# Patient Record
Sex: Female | Born: 1945 | Hispanic: No | Smoking: Former smoker
Health system: Southern US, Community
[De-identification: ages and names within clinical notes are randomized; demographics above are authoritative.]

## PROBLEM LIST (undated history)

## (undated) DIAGNOSIS — M8580 Other specified disorders of bone density and structure, unspecified site: Secondary | ICD-10-CM

## (undated) DIAGNOSIS — R0602 Shortness of breath: Secondary | ICD-10-CM

## (undated) DIAGNOSIS — M545 Low back pain, unspecified: Secondary | ICD-10-CM

## (undated) DIAGNOSIS — K219 Gastro-esophageal reflux disease without esophagitis: Secondary | ICD-10-CM

## (undated) DIAGNOSIS — I119 Hypertensive heart disease without heart failure: Secondary | ICD-10-CM

## (undated) DIAGNOSIS — G8929 Other chronic pain: Secondary | ICD-10-CM

## (undated) DIAGNOSIS — E785 Hyperlipidemia, unspecified: Secondary | ICD-10-CM

## (undated) DIAGNOSIS — J42 Unspecified chronic bronchitis: Secondary | ICD-10-CM

## (undated) DIAGNOSIS — F411 Generalized anxiety disorder: Secondary | ICD-10-CM

## (undated) HISTORY — DX: Other specified disorders of bone density and structure, unspecified site: M85.80

## (undated) HISTORY — DX: Low back pain: M54.5

## (undated) HISTORY — DX: Shortness of breath: R06.02

## (undated) HISTORY — DX: Hyperlipidemia, unspecified: E78.5

## (undated) HISTORY — DX: Other chronic pain: G89.29

## (undated) HISTORY — DX: Gastro-esophageal reflux disease without esophagitis: K21.9

## (undated) HISTORY — DX: Generalized anxiety disorder: F41.1

## (undated) HISTORY — DX: Low back pain, unspecified: M54.50

## (undated) HISTORY — DX: Unspecified chronic bronchitis: J42

## (undated) HISTORY — DX: Hypertensive heart disease without heart failure: I11.9

---

## 1968-03-06 HISTORY — PX: CHOLECYSTECTOMY: SHX55

## 1968-03-06 HISTORY — PX: APPENDECTOMY: SHX54

## 2013-09-22 ENCOUNTER — Encounter: Payer: Self-pay | Admitting: Critical Care Medicine

## 2013-09-23 ENCOUNTER — Telehealth: Payer: Self-pay | Admitting: Critical Care Medicine

## 2013-09-23 ENCOUNTER — Encounter: Payer: Self-pay | Admitting: Critical Care Medicine

## 2013-09-23 ENCOUNTER — Ambulatory Visit (INDEPENDENT_AMBULATORY_CARE_PROVIDER_SITE_OTHER): Payer: Self-pay | Admitting: Critical Care Medicine

## 2013-09-23 VITALS — BP 132/68 | HR 100 | Temp 98.9°F | Ht 65.5 in | Wt 192.4 lb

## 2013-09-23 DIAGNOSIS — K219 Gastro-esophageal reflux disease without esophagitis: Secondary | ICD-10-CM | POA: Insufficient documentation

## 2013-09-23 DIAGNOSIS — M8580 Other specified disorders of bone density and structure, unspecified site: Secondary | ICD-10-CM | POA: Insufficient documentation

## 2013-09-23 DIAGNOSIS — E669 Obesity, unspecified: Secondary | ICD-10-CM | POA: Insufficient documentation

## 2013-09-23 DIAGNOSIS — J432 Centrilobular emphysema: Secondary | ICD-10-CM

## 2013-09-23 DIAGNOSIS — J209 Acute bronchitis, unspecified: Secondary | ICD-10-CM

## 2013-09-23 DIAGNOSIS — J441 Chronic obstructive pulmonary disease with (acute) exacerbation: Secondary | ICD-10-CM | POA: Insufficient documentation

## 2013-09-23 DIAGNOSIS — E785 Hyperlipidemia, unspecified: Secondary | ICD-10-CM | POA: Insufficient documentation

## 2013-09-23 DIAGNOSIS — I119 Hypertensive heart disease without heart failure: Secondary | ICD-10-CM | POA: Insufficient documentation

## 2013-09-23 MED ORDER — ACLIDINIUM BROMIDE 400 MCG/ACT IN AEPB: 1.0000 | INHALATION_SPRAY | Freq: Two times a day (BID) | RESPIRATORY_TRACT | Status: DC

## 2013-09-23 MED ORDER — METHYLPREDNISOLONE ACETATE 80 MG/ML IJ SUSP
120.0000 mg | Freq: Once | INTRAMUSCULAR | Status: AC
  Administered 2013-09-23: 120 mg via INTRAMUSCULAR

## 2013-09-23 MED ORDER — PREDNISONE 5 MG PO TABS: ORAL_TABLET | ORAL | Status: DC

## 2013-09-23 MED ORDER — AZITHROMYCIN 250 MG PO TABS: ORAL_TABLET | ORAL | Status: DC

## 2013-09-23 NOTE — Progress Notes (Signed)
Subjective:    Patient ID: Teresa Jenkins, female    DOB: [birth year], 68 y.o.   MRN: [id number]  HPI Comments: Ref : thomas whyte.  Hx of dyspnea and more labored.  On oxygen for years. Dx Copd.  No longer smoking, none since 1995.  On 2Liters qhs and 3L exertion  Shortness of Breath This is a chronic problem. The current episode started more than 1 year ago. The problem occurs daily (exertion only, short distance only. ok at rest). The problem has been gradually worsening. Associated symptoms include wheezing. Pertinent negatives include no abdominal pain, chest pain, claudication, fever, headaches, hemoptysis, leg pain, leg swelling, orthopnea, PND, rash, rhinorrhea, sore throat, sputum production, swollen glands, syncope or vomiting. The symptoms are aggravated by weather changes, URIs, pollens and any activity. Associated symptoms comments: Feels like mucus, wont come up Hx of GERD, controlled . Risk factors include smoking. She has tried beta agonist inhalers (pulm rehab: 60yrs ago.  ) for the symptoms. The treatment provided moderate relief. Her past medical history is significant for COPD. There is no history of allergies, aspirin allergies, asthma, bronchiolitis, CAD, DVT, a heart failure, PE, pneumonia or a recent surgery.  Hx of hypothyroidism.    Past Medical History  Diagnosis Date  . Chronic bronchitis   . Benign hypertensive heart disease   . GAD (generalized anxiety disorder)   . SOB (shortness of breath)   . Acid reflux   . Chronic low back pain   . Hyperlipidemia   . Osteopenia      Family History  Problem Relation Age of Onset  . Heart disease Father   . Breast cancer Mother   . Hypertension Mother   . Hypertension Father   . Emphysema Father   . Allergies       History   Social History  . Marital Status: Unknown    Spouse Name: N/A    Number of Children: N/A  . Years of Education: N/A   Occupational History  . retired     Social worker, [employer]    Social History Main Topics  . Smoking status: Former Smoker -- 1.00 packs/day for 30 years    Types: Cigarettes    Start date: 03/07/1963    Quit date: 03/06/1993  . Smokeless tobacco: Never Used  . Alcohol Use: No  . Drug Use: No  . Sexual Activity: Not on file   Other Topics Concern  . Not on file   Social History Narrative  . No narrative on file     No Known Allergies   Outpatient Prescriptions Prior to Visit  Medication Sig Dispense Refill  . albuterol (PROVENTIL HFA;VENTOLIN HFA) 108 (90 BASE) MCG/ACT inhaler Inhale 1-2 puffs into the lungs every 6 (six) hours as needed for wheezing or shortness of breath.      Marland Kitchen aspirin 81 MG tablet Take 81 mg by mouth daily.      . fluticasone (FLONASE) 50 MCG/ACT nasal spray Place 2 sprays into both nostrils daily as needed.       . formoterol (PERFOROMIST) 20 MCG/2ML nebulizer solution Take 20 mcg by nebulization 2 (two) times daily.      Marland Kitchen levothyroxine (SYNTHROID, LEVOTHROID) 75 MCG tablet Take 75 mcg by mouth daily before breakfast.      . loratadine (CLARITIN) 10 MG tablet Take 10 mg by mouth daily.      Marland Kitchen omeprazole (PRILOSEC) 20 MG capsule Take 20 mg by mouth daily.      Marland Kitchen  OXYGEN-HELIUM IN Inhale into the lungs. 2LPM 24/7      . PARoxetine (PAXIL) 40 MG tablet Take 20 mg by mouth every morning.       . Potassium Gluconate 595 MG CAPS Take 1 capsule by mouth daily.      . pravastatin (PRAVACHOL) 20 MG tablet Take 20 mg by mouth daily.      Marland Kitchen tiotropium (SPIRIVA) 18 MCG inhalation capsule Place 18 mcg into inhaler and inhale daily.      . calcium carbonate (OS-CAL) 600 MG TABS tablet Take 600 mg by mouth daily with breakfast.       . predniSONE (DELTASONE) 5 MG tablet Take 5 mg by mouth daily with breakfast.      . alendronate (FOSAMAX) 70 MG tablet Take 70 mg by mouth once a week. Take with a full glass of water on an empty stomach.      . budesonide (PULMICORT) 0.25 MG/2ML nebulizer solution Take 0.25 mg by nebulization 2 (two)  times daily.      . Multiple Vitamins-Minerals (PRESERVISION AREDS 2 PO) Take 1 tablet by mouth daily.       No facility-administered medications prior to visit.      Review of Systems  Constitutional: Positive for fatigue. Negative for fever.  HENT: Positive for postnasal drip, sinus pressure, trouble swallowing and voice change. Negative for rhinorrhea and sore throat.   Respiratory: Positive for cough, choking, shortness of breath and wheezing. Negative for apnea, hemoptysis, sputum production, chest tightness and stridor.        Chokes on dried foods  Cardiovascular: Negative for chest pain, orthopnea, claudication, leg swelling, syncope and PND.  Gastrointestinal: Negative.  Negative for vomiting and abdominal pain.  Skin: Negative for rash.  Neurological: Negative for headaches.       Objective:   Physical Exam Filed Vitals:   09/23/13 1001 09/23/13 1005  BP:  132/68  Pulse: 125 100  Temp:  98.9 F (37.2 C)  TempSrc:  Oral  Height:  5' 5.5" (1.664 m)  Weight:  192 lb 6.4 oz (87.272 kg)  SpO2: 88% 94%    Gen: Pleasant, well-nourished, in no distress,  normal affect  ENT: No lesions,  mouth clear,  oropharynx clear, no postnasal drip  Neck: No JVD, no TMG, no carotid bruits  Lungs: No use of accessory muscles, no dullness to percussion, expired wheezes with poor airflow  Cardiovascular: RRR, heart sounds normal, no murmur or gallops, no peripheral edema  Abdomen: soft and NT, no HSM,  BS normal  Musculoskeletal: No deformities, no cyanosis or clubbing  Neuro: alert, non focal  Skin: Warm, no lesions or rashes  No results found.    Radiographs reviewed and showed COPD changes    Assessment & Plan:   Bronchitis, chronic obstructive, with exacerbation Chronic obstructive lung disease with acute bronchitic flare The patient is a former smoker Plan Azithromycin 250mg  Take two once then one daily until gone Depomedrol 120mg  IM Spirometry will be  obtained Referral to pulmonary rehab Stop spiriva Start tudorza one puff twice daily Stay on oxygen  Stay on perforomist/budesonide Return 2 months   Significant weight gain with BMI greater than 30 The patient was given instructions as to dietary modifications and referral to pulmonary rehabilitation   Updated Medication List Outpatient Encounter Prescriptions as of 09/23/2013  Medication Sig  . albuterol (PROVENTIL HFA;VENTOLIN HFA) 108 (90 BASE) MCG/ACT inhaler Inhale 1-2 puffs into the lungs every 6 (six) hours as needed for wheezing  or shortness of breath.  Marland Kitchen. aspirin 81 MG tablet Take 81 mg by mouth daily.  . budesonide (PULMICORT) 0.5 MG/2ML nebulizer solution Take 0.5 mg by nebulization 2 (two) times daily.  . calcium citrate-vitamin D 500-400 MG-UNIT chewable tablet Chew 1 tablet by mouth daily.  . fluticasone (FLONASE) 50 MCG/ACT nasal spray Place 2 sprays into both nostrils daily as needed.   . formoterol (PERFOROMIST) 20 MCG/2ML nebulizer solution Take 20 mcg by nebulization 2 (two) times daily.  Marland Kitchen. levothyroxine (SYNTHROID, LEVOTHROID) 75 MCG tablet Take 75 mcg by mouth daily before breakfast.  . loratadine (CLARITIN) 10 MG tablet Take 10 mg by mouth daily.  . Magnesium 250 MG TABS Take 1 tablet by mouth daily.  . Misc Natural Products (DAILY HERBS VISION HEALTH PO) Take 1 tablet by mouth daily.  Marland Kitchen. omeprazole (PRILOSEC) 20 MG capsule Take 20 mg by mouth daily.  Docia Barrier. OXYGEN-HELIUM IN Inhale into the lungs. 2LPM 24/7  . PARoxetine (PAXIL) 40 MG tablet Take 20 mg by mouth every morning.   . Potassium Gluconate 595 MG CAPS Take 1 capsule by mouth daily.  . pravastatin (PRAVACHOL) 20 MG tablet Take 20 mg by mouth daily.  . predniSONE (DELTASONE) 5 MG tablet Reduce to  5mg  every other day times two weeks then 5mg  three times weekly for two weeks then STOP  . tiotropium (SPIRIVA) 18 MCG inhalation capsule Place 18 mcg into inhaler and inhale daily.  . [DISCONTINUED] calcium carbonate  (OS-CAL) 600 MG TABS tablet Take 600 mg by mouth daily with breakfast.   . [DISCONTINUED] predniSONE (DELTASONE) 5 MG tablet Take 5 mg by mouth daily with breakfast.  . Aclidinium Bromide (TUDORZA PRESSAIR) 400 MCG/ACT AEPB Inhale 1 puff into the lungs 2 (two) times daily.  Marland Kitchen. azithromycin (ZITHROMAX) 250 MG tablet Take two once then one daily until gone  . [DISCONTINUED] alendronate (FOSAMAX) 70 MG tablet Take 70 mg by mouth once a week. Take with a full glass of water on an empty stomach.  . [DISCONTINUED] budesonide (PULMICORT) 0.25 MG/2ML nebulizer solution Take 0.25 mg by nebulization 2 (two) times daily.  . [DISCONTINUED] Multiple Vitamins-Minerals (PRESERVISION AREDS 2 PO) Take 1 tablet by mouth daily.  . [EXPIRED] methylPREDNISolone acetate (DEPO-MEDROL) injection 120 mg

## 2013-09-23 NOTE — Telephone Encounter (Signed)
Spoke with pt - aware [facility].  This has been corrected.  [facility] [place] [address]  Sherron MondaySpoke with [facility] and cancelled rx.  Nothing further needed.

## 2013-09-23 NOTE — Assessment & Plan Note (Signed)
Chronic obstructive lung disease with acute bronchitic flare The patient is a former smoker Plan Azithromycin 250mg  Take two once then one daily until gone Depomedrol 120mg  IM Spirometry will be obtained Referral to pulmonary rehab Stop spiriva Start tudorza one puff twice daily Stay on oxygen  Stay on perforomist/budesonide Return 2 months

## 2013-09-23 NOTE — Patient Instructions (Signed)
Azithromycin 250mg  Take two once then one daily until gone Depomedrol 120mg  IM Spirometry will be obtained Referral to pulmonary rehab Stop spiriva Start tudorza one puff twice daily Stay on oxygen  Stay on perforomist/budesonide Return 2 months

## 2013-10-14 ENCOUNTER — Telehealth: Payer: Self-pay | Admitting: Critical Care Medicine

## 2013-10-14 DIAGNOSIS — J441 Chronic obstructive pulmonary disease with (acute) exacerbation: Secondary | ICD-10-CM

## 2013-10-14 NOTE — Telephone Encounter (Signed)
Tell pt spiro shows good response to BDs.  Shows severe obstruction.  Stay on program Rx at 09/2013 Ov

## 2013-10-14 NOTE — Telephone Encounter (Signed)
Called, spoke with pt.  Informed her of pft results and recs per Dr. Delford FieldWright.  She verbalized understanding and voiced no further questions or concerns at this time.  Results placed in scan folder.

## 2013-11-25 ENCOUNTER — Ambulatory Visit (INDEPENDENT_AMBULATORY_CARE_PROVIDER_SITE_OTHER): Payer: Self-pay | Admitting: Critical Care Medicine

## 2013-11-25 ENCOUNTER — Encounter: Payer: Self-pay | Admitting: Critical Care Medicine

## 2013-11-25 VITALS — BP 138/78 | HR 81 | Temp 98.6°F | Ht 65.5 in | Wt 184.4 lb

## 2013-11-25 DIAGNOSIS — J441 Chronic obstructive pulmonary disease with (acute) exacerbation: Secondary | ICD-10-CM

## 2013-11-25 MED ORDER — BUDESONIDE 0.5 MG/2ML IN SUSP: 0.5000 mg | Freq: Every day | RESPIRATORY_TRACT | Status: DC

## 2013-11-25 NOTE — Patient Instructions (Signed)
Reduce budesonide to daily Continue perforomist twice daily We will get you in pulmonary rehab Return 4 months

## 2013-11-25 NOTE — Progress Notes (Signed)
Subjective:    Patient ID: Teresa Jenkins, female    DOB: 1945-10-02, 68 y.o.   MRN: [id number]  HPI Comments: Ref : thomas whyte.  Hx of dyspnea and more labored.  On oxygen for years. Dx Copd.  No longer smoking, none since 1995.  On 2Liters qhs and 3L exertion Hx of hypothyroidism.    11/25/2013 Chief Complaint  Patient presents with  . Follow-up    sob better since weather change,wheezing,no cough,denies cp , tightness when sob only  Noone called yet from pulm rehab.  No cough.  No chest pain, just tightness if gets out of breath.  Occ mucus, tinged yellow.  No blood.   Pulse ox at home:  95% at home.     Review of Systems  Constitutional: Positive for fatigue.  HENT: Positive for postnasal drip and sinus pressure. Negative for trouble swallowing and voice change.   Respiratory: Positive for cough. Negative for apnea, choking, chest tightness and stridor.        Chokes on dried foods  Gastrointestinal: Negative.        Objective:   Physical Exam  Filed Vitals:   11/25/13 1204  BP: 138/78  Pulse: 81  Temp: 98.6 F (37 C)  TempSrc: Oral  Height: 5' 5.5" (1.664 m)  Weight: 184 lb 6.4 oz (83.643 kg)  SpO2: 95%    Gen: Pleasant, well-nourished, in no distress,  normal affect  ENT: No lesions,  mouth clear,  oropharynx clear, no postnasal drip  Neck: No JVD, no TMG, no carotid bruits  Lungs: No use of accessory muscles, no dullness to percussion, distant breath sounds  Cardiovascular: RRR, heart sounds normal, no murmur or gallops, no peripheral edema  Abdomen: soft and NT, no HSM,  BS normal  Musculoskeletal: No deformities, no cyanosis or clubbing  Neuro: alert, non focal  Skin: Warm, no lesions or rashes  No results found.    Radiographs reviewed and showed COPD changes    Assessment & Plan:   Bronchitis, chronic obstructive, with exacerbation Chronic obstructive lung disease stable at this time Plan Reduce budesonide to daily Continue perforomist  twice daily Referral to  pulmonary rehab Return 4 months      Updated Medication List Outpatient Encounter Prescriptions as of 11/25/2013  Medication Sig  . Aclidinium Bromide (TUDORZA PRESSAIR) 400 MCG/ACT AEPB Inhale 1 puff into the lungs 2 (two) times daily.  Marland Kitchen albuterol (PROVENTIL HFA;VENTOLIN HFA) 108 (90 BASE) MCG/ACT inhaler Inhale 1-2 puffs into the lungs every 6 (six) hours as needed for wheezing or shortness of breath.  Marland Kitchen aspirin 81 MG tablet Take 81 mg by mouth. Take 1 tablet every other day by mouth  . budesonide (PULMICORT) 0.5 MG/2ML nebulizer solution Take 2 mLs (0.5 mg total) by nebulization daily.  . calcium citrate-vitamin D 500-400 MG-UNIT chewable tablet Chew 1 tablet by mouth daily.  . fluticasone (FLONASE) 50 MCG/ACT nasal spray Place 2 sprays into both nostrils daily as needed.   . formoterol (PERFOROMIST) 20 MCG/2ML nebulizer solution Take 20 mcg by nebulization 2 (two) times daily.  Marland Kitchen levothyroxine (SYNTHROID, LEVOTHROID) 75 MCG tablet Take 75 mcg by mouth daily before breakfast.  . loratadine (CLARITIN) 10 MG tablet Take 10 mg by mouth daily.  . Magnesium 250 MG TABS Take 1 tablet by mouth daily.  . Misc Natural Products (DAILY HERBS VISION HEALTH PO) Take 1 tablet by mouth daily.  Marland Kitchen omeprazole (PRILOSEC) 20 MG capsule Take 20 mg by mouth daily.  Marland Kitchen PARoxetine (  PAXIL) 40 MG tablet Take 20 mg by mouth every morning.   . Potassium Gluconate 595 MG CAPS Take 1 capsule by mouth daily.  . pravastatin (PRAVACHOL) 20 MG tablet Take 20 mg by mouth daily.  . [DISCONTINUED] budesonide (PULMICORT) 0.5 MG/2ML nebulizer solution Take 0.5 mg by nebulization 2 (two) times daily.  . [DISCONTINUED] OXYGEN-HELIUM IN Inhale into the lungs. 2LPM 24/7  . [DISCONTINUED] azithromycin (ZITHROMAX) 250 MG tablet Take two once then one daily until gone  . [DISCONTINUED] predniSONE (DELTASONE) 5 MG tablet Reduce to   every other day times two weeks then  three times weekly for two  weeks then STOP  . [DISCONTINUED] tiotropium (SPIRIVA) 18 MCG inhalation capsule Place 18 mcg into inhaler and inhale daily.

## 2013-11-26 NOTE — Assessment & Plan Note (Signed)
Chronic obstructive lung disease stable at this time Plan Reduce budesonide to daily Continue perforomist twice daily Referral to  pulmonary rehab Return 4 months

## 2014-01-09 ENCOUNTER — Telehealth: Payer: Self-pay | Admitting: Critical Care Medicine

## 2014-01-09 NOTE — Telephone Encounter (Signed)
Called and spoke with pt and she stated that her [place] will be covered by her insurance until January.  She stated at that time she will need to change to spirive (she was on this before [place]).  She will need this sent over to [facility].  Pt wanted to make sure that this is ok.  PW please advise. Thanks  No Known Allergies  Current Outpatient Prescriptions on File Prior to Visit  Medication Sig Dispense Refill  . Aclidinium Bromide (TUDORZA PRESSAIR) 400 MCG/ACT AEPB Inhale 1 puff into the lungs 2 (two) times daily. 1 each 11  . albuterol (PROVENTIL HFA;VENTOLIN HFA) 108 (90 BASE) MCG/ACT inhaler Inhale 1-2 puffs into the lungs every 6 (six) hours as needed for wheezing or shortness of breath.    Marland Kitchen. aspirin 81 MG tablet Take 81 mg by mouth. Take 1 tablet every other day by mouth    . budesonide (PULMICORT) 0.5 MG/2ML nebulizer solution Take 2 mLs (0.5 mg total) by nebulization daily. 60 mL 6  . calcium citrate-vitamin D 500-400 MG-UNIT chewable tablet Chew 1 tablet by mouth daily.    . fluticasone (FLONASE) 50 MCG/ACT nasal spray Place 2 sprays into both nostrils daily as needed.     . formoterol (PERFOROMIST) 20 MCG/2ML nebulizer solution Take 20 mcg by nebulization 2 (two) times daily.    Marland Kitchen. levothyroxine (SYNTHROID, LEVOTHROID) 75 MCG tablet Take 75 mcg by mouth daily before breakfast.    . loratadine (CLARITIN) 10 MG tablet Take 10 mg by mouth daily.    . Magnesium 250 MG TABS Take 1 tablet by mouth daily.    . Misc Natural Products (DAILY HERBS VISION HEALTH PO) Take 1 tablet by mouth daily.    Marland Kitchen. omeprazole (PRILOSEC) 20 MG capsule Take 20 mg by mouth daily.    Marland Kitchen. PARoxetine (PAXIL) 40 MG tablet Take 20 mg by mouth every morning.     . Potassium Gluconate 595 MG CAPS Take 1 capsule by mouth daily.    . pravastatin (PRAVACHOL) 20 MG tablet Take 20 mg by mouth daily.     No current facility-administered medications on file prior to visit.

## 2014-01-10 NOTE — Telephone Encounter (Signed)
Ok to switch to spiriva respimat two puff daily

## 2014-01-12 MED ORDER — TIOTROPIUM BROMIDE MONOHYDRATE 2.5 MCG/ACT IN AERS: 2.0000 | INHALATION_SPRAY | Freq: Every day | RESPIRATORY_TRACT | Status: DC

## 2014-01-12 NOTE — Telephone Encounter (Signed)
RX printed, signed, and faxed to [facility].  Nothing further needed.

## 2014-04-14 ENCOUNTER — Ambulatory Visit (INDEPENDENT_AMBULATORY_CARE_PROVIDER_SITE_OTHER): Payer: Self-pay | Admitting: Critical Care Medicine

## 2014-04-14 ENCOUNTER — Encounter: Payer: Self-pay | Admitting: Critical Care Medicine

## 2014-04-14 VITALS — BP 150/88 | HR 78 | Temp 98.2°F | Ht 65.5 in | Wt 182.6 lb

## 2014-04-14 DIAGNOSIS — J449 Chronic obstructive pulmonary disease, unspecified: Secondary | ICD-10-CM

## 2014-04-14 DIAGNOSIS — J441 Chronic obstructive pulmonary disease with (acute) exacerbation: Secondary | ICD-10-CM

## 2014-04-14 NOTE — Progress Notes (Signed)
Subjective:    Patient ID: Teresa Jenkins, female    DOB: [birth year], 69 y.o.   MRN: [id number]  HPI Comments: Ref : thomas whyte.  Hx of dyspnea and more labored.  On oxygen for years. Dx Copd.  No longer smoking, none since 1995.  On 2Liters qhs and 3L exertion Hx of hypothyroidism.      04/14/2014 Chief Complaint  Patient presents with  . Follow-up    sob-same,no wheezing, no cough.Denies cp or tightness.Had sinus infection,had abx. with primary MD and Prednisone 1 wk. ago.    Not real change from 11/2013.  Pt remains on perforomist /budesonide.  Pt went to pulm rehab but had to quit.  Pt has a TMT at home and exercise bike at home. No smoking.  No wheeze or cough. PCP rx pred and abx one week ago for sinus.  Sinus still congested.  No mucus.  No chest pain. NO f/c/s.  On oxygen 2Liters continuous Pt denies any significant sore throat, nasal congestion or excess secretions, fever, chills, sweats, unintended weight loss, pleurtic or exertional chest pain, orthopnea PND, or leg swelling Pt denies any increase in rescue therapy over baseline, denies waking up needing it or having any early am or nocturnal exacerbations of coughing/wheezing/or dyspnea. Pt also denies any obvious fluctuation in symptoms with  weather or environmental change or other alleviating or aggravating factors     Review of Systems  Constitutional: Positive for fatigue.  HENT: Positive for postnasal drip and sinus pressure. Negative for trouble swallowing and voice change.   Respiratory: Positive for cough. Negative for apnea, choking, chest tightness and stridor.        Chokes on dried foods  Gastrointestinal: Negative.        Objective:   Physical Exam Filed Vitals:   04/14/14 1222  BP: 150/88  Pulse: 78  Temp: 98.2 F (36.8 C)  TempSrc: Oral  Height: 5' 5.5" (1.664 m)  Weight: 182 lb 9.6 oz (82.827 kg)  SpO2: 92%    Gen: Pleasant, well-nourished, in no distress,  normal affect  ENT: No lesions,   mouth clear,  oropharynx clear, no postnasal drip  Neck: No JVD, no TMG, no carotid bruits  Lungs: No use of accessory muscles, no dullness to percussion, distant breath sounds  Cardiovascular: RRR, heart sounds normal, no murmur or gallops, no peripheral edema  Abdomen: soft and NT, no HSM,  BS normal  Musculoskeletal: No deformities, no cyanosis or clubbing  Neuro: alert, non focal  Skin: Warm, no lesions or rashes  No results found.        Assessment & Plan:   Bronchitis, chronic obstructive, with exacerbation Chronic obstructive lung disease with associated asthmatic bronchitic component stable at this time Plan Continue inhaled medications as prescribed      Updated Medication List Outpatient Encounter Prescriptions as of 04/14/2014  Medication Sig  . albuterol (PROVENTIL HFA;VENTOLIN HFA) 108 (90 BASE) MCG/ACT inhaler Inhale 1-2 puffs into the lungs every 6 (six) hours as needed for wheezing or shortness of breath.  Marland Kitchen aspirin 81 MG tablet Take 81 mg by mouth. Take 1 tablet every other day by mouth  . budesonide (PULMICORT) 0.5 MG/2ML nebulizer solution Take 2 mLs (0.5 mg total) by nebulization daily.  . calcium citrate-vitamin D 500-400 MG-UNIT chewable tablet Chew 1 tablet by mouth daily.  . fluticasone (FLONASE) 50 MCG/ACT nasal spray Place 2 sprays into both nostrils daily as needed.   . formoterol (PERFOROMIST) 20 MCG/2ML nebulizer solution Take  20 mcg by nebulization 2 (two) times daily.  Marland Kitchen. levothyroxine (SYNTHROID, LEVOTHROID) 75 MCG tablet Take 75 mcg by mouth daily before breakfast.  . loratadine (CLARITIN) 10 MG tablet Take 10 mg by mouth daily.  . Misc Natural Products (DAILY HERBS VISION HEALTH PO) Take 1 tablet by mouth daily.  . Multiple Vitamin (MULTIVITAMIN) capsule Take 1 capsule by mouth daily.  Marland Kitchen. omeprazole (PRILOSEC) 20 MG capsule Take 20 mg by mouth daily.  Marland Kitchen. PARoxetine (PAXIL) 40 MG tablet Take 37.5 mg by mouth every morning.   . pravastatin  (PRAVACHOL) 20 MG tablet Take 20 mg by mouth daily.  . Tiotropium Bromide Monohydrate (SPIRIVA RESPIMAT) 2.5 MCG/ACT AERS Inhale 2 puffs into the lungs daily.  . [DISCONTINUED] Magnesium 250 MG TABS Take 1 tablet by mouth daily.  . [DISCONTINUED] Potassium Gluconate 595 MG CAPS Take 1 capsule by mouth daily.

## 2014-04-14 NOTE — Assessment & Plan Note (Signed)
Chronic obstructive lung disease with associated asthmatic bronchitic component stable at this time Plan Continue inhaled medications as prescribed

## 2014-04-14 NOTE — Patient Instructions (Signed)
No change in medications. Return in         6 months Use distilled water in your nedi pot

## 2014-10-20 ENCOUNTER — Ambulatory Visit (INDEPENDENT_AMBULATORY_CARE_PROVIDER_SITE_OTHER): Payer: Self-pay | Admitting: Critical Care Medicine

## 2014-10-20 ENCOUNTER — Encounter: Payer: Self-pay | Admitting: Critical Care Medicine

## 2014-10-20 VITALS — BP 130/62 | HR 90 | Temp 99.0°F | Ht 65.25 in | Wt 183.4 lb

## 2014-10-20 DIAGNOSIS — J441 Chronic obstructive pulmonary disease with (acute) exacerbation: Secondary | ICD-10-CM

## 2014-10-20 DIAGNOSIS — J9611 Chronic respiratory failure with hypoxia: Secondary | ICD-10-CM

## 2014-10-20 DIAGNOSIS — J449 Chronic obstructive pulmonary disease, unspecified: Secondary | ICD-10-CM

## 2014-10-20 NOTE — Patient Instructions (Signed)
No change in medications. Return in        6 months        

## 2014-10-20 NOTE — Assessment & Plan Note (Signed)
Chronic obstructive lung disease with stable lung function Plan Maintain inhaled medications as prescribed

## 2014-10-20 NOTE — Progress Notes (Signed)
   Subjective:    Patient ID: Teresa Jenkins, female    DOB: [birth year], 69 y.o.   MRN: [id number]  HPI 10/20/2014 Chief Complaint  Patient presents with  . Follow-up    c/o sob same with exertion,trying to stay indoors more due to humidity.Cough-white thick.Occass. wheezing.   occ wheeze, doe same. Mucus traps in lungs Pt denies any significant sore throat, nasal congestion or excess secretions, fever, chills, sweats, unintended weight loss, pleurtic or exertional chest pain, orthopnea PND, or leg swelling Pt denies any increase in rescue therapy over baseline, denies waking up needing it or having any early am or nocturnal exacerbations of coughing/wheezing/or dyspnea. Pt also denies any obvious fluctuation in symptoms with  weather or environmental change or other alleviating or aggravating factors   Current Medications, Allergies, Complete Past Medical History, Past Surgical History, Family History, and Social History were reviewed in [employer] electronic medical record per todays encounter:  10/20/2014  Review of Systems  Constitutional: Negative.   HENT: Negative.  Negative for ear pain, postnasal drip, rhinorrhea, sinus pressure, sore throat, trouble swallowing and voice change.   Eyes: Negative.   Respiratory: Positive for cough, shortness of breath and wheezing. Negative for apnea, choking, chest tightness and stridor.   Cardiovascular: Negative.  Negative for chest pain, palpitations and leg swelling.  Gastrointestinal: Negative.  Negative for nausea, vomiting, abdominal pain and abdominal distention.       Has gerd and meds help  Genitourinary: Negative.   Musculoskeletal: Negative.  Negative for myalgias and arthralgias.  Skin: Negative.  Negative for rash.  Allergic/Immunologic: Negative.  Negative for environmental allergies and food allergies.  Neurological: Negative.  Negative for dizziness, syncope, weakness and headaches.  Hematological: Negative.  Negative for  adenopathy. Does not bruise/bleed easily.  Psychiatric/Behavioral: Negative.  Negative for sleep disturbance and agitation. The patient is not nervous/anxious.        Objective:   Physical Exam Filed Vitals:   10/20/14 1201  BP: 130/62  Pulse: 90  Temp: 99 F (37.2 C)  TempSrc: Oral  Height: 5' 5.25" (1.657 m)  SpO2: 97%    Gen: Pleasant, well-nourished, in no distress,  normal affect  ENT: No lesions,  mouth clear,  oropharynx clear, no postnasal drip  Neck: No JVD, no TMG, no carotid bruits  Lungs: No use of accessory muscles, no dullness to percussion, distant bs   Cardiovascular: RRR, heart sounds normal, no murmur or gallops, no peripheral edema  Abdomen: soft and NT, no HSM,  BS normal  Musculoskeletal: No deformities, no cyanosis or clubbing  Neuro: alert, non focal  Skin: Warm, no lesions or rashes  No results found.        Assessment & Plan:  I personally reviewed all images and lab data in the [facility] system as well as any outside material available during this office visit and agree with the  radiology impressions.   Bronchitis, chronic obstructive, with exacerbation Chronic obstructive lung disease with stable lung function Plan Maintain inhaled medications as prescribed

## 2014-10-24 ENCOUNTER — Other Ambulatory Visit: Payer: Self-pay | Admitting: Critical Care Medicine

## 2015-04-19 ENCOUNTER — Ambulatory Visit: Payer: Self-pay | Admitting: Pulmonary Disease

## 2015-05-03 ENCOUNTER — Ambulatory Visit: Payer: Self-pay | Admitting: Pulmonary Disease

## 2015-05-10 ENCOUNTER — Other Ambulatory Visit: Payer: Self-pay | Admitting: Orthopedic Surgery

## 2015-05-10 DIAGNOSIS — M25532 Pain in left wrist: Secondary | ICD-10-CM

## 2015-05-31 ENCOUNTER — Other Ambulatory Visit: Payer: Self-pay

## 2016-08-30 ENCOUNTER — Institutional Professional Consult (permissible substitution): Payer: Self-pay | Admitting: Internal Medicine

## 2016-11-14 ENCOUNTER — Institutional Professional Consult (permissible substitution): Payer: Self-pay | Admitting: Internal Medicine

## 2016-12-18 ENCOUNTER — Institutional Professional Consult (permissible substitution): Payer: Self-pay | Admitting: Internal Medicine

## 2017-01-03 ENCOUNTER — Ambulatory Visit (INDEPENDENT_AMBULATORY_CARE_PROVIDER_SITE_OTHER)
Admission: RE | Admit: 2017-01-03 | Discharge: 2017-01-03 | Disposition: A | Discharge status: Home / Self Care | Payer: Medicare HMO | Source: Ambulatory Visit | Attending: Internal Medicine | Admitting: Internal Medicine

## 2017-01-03 ENCOUNTER — Encounter: Payer: Self-pay | Admitting: Internal Medicine

## 2017-01-03 ENCOUNTER — Ambulatory Visit (INDEPENDENT_AMBULATORY_CARE_PROVIDER_SITE_OTHER): Payer: Medicare HMO | Admitting: Internal Medicine

## 2017-01-03 VITALS — BP 108/60 | HR 88 | Ht 65.25 in | Wt 166.8 lb

## 2017-01-03 DIAGNOSIS — J9611 Chronic respiratory failure with hypoxia: Secondary | ICD-10-CM

## 2017-01-03 DIAGNOSIS — J449 Chronic obstructive pulmonary disease, unspecified: Secondary | ICD-10-CM | POA: Diagnosis not present

## 2017-01-03 NOTE — Progress Notes (Signed)
Subjective:     Patient ID: Teresa Jenkins, female   DOB: 04/07/1945,     MRN: [id number]  HPI  71 yowf  From [place] moved to [place] in 1970s quit smoking in 1995 with breathing bad to worse with h/o exacerbations she related to work exposure and lots of rhinitis which has persisted  eval by Chad in [place] dx mold and prev under care of Dr Blenda Nicely, Delford Field then [facility] but referred to pulmonary clinic 01/03/2017 by Dr   Martha Clan for copd eval.  Alpha one neg per [facility]    01/03/2017 1st [place] [facility] office visit/ Kasidi Shanker   Chief Complaint  Patient presents with  . Pulmonary Consult    Referred by Dr. Arlan Organ. Pt states she was dxed with COPD approx 21 years ago. She states her PCP rec that she f/u with pulm again. She has been seen here in the past by Dr Delford Field- last in 2016.  She states that today her breathing is "not too bad". She does get SOB with exertion such as grocery shopping, and she will not walk to her mailbox anymore due to having to walk up hill.    doe on best days = [employer] / [employer] ok leaning on cart on 2lpm / also 2lpm hs  = MMRC2 = can't walk a nl pace on a flat grade s sob but does fine slow and flat eg shopping leaning on cart   Presently maint on bud/ performist / incruse Peak wt 190    No obvious day to day or daytime variability or assoc  purulent sputum or mucus plugs or hemoptysis or cp or chest tightness, subjective wheeze or overt sinus or hb symptoms. No unusual exp hx or h/o childhood pna/ asthma or knowledge of premature birth.  Sleeping ok flat on 2lpm NP without nocturnal  or early am exacerbation  of respiratory  c/o's or need for noct saba. Also denies any obvious fluctuation of symptoms with weather or environmental changes or other aggravating or alleviating factors except as outlined above   Current Allergies, Complete Past Medical History, Past Surgical History, Family History, and Social History were reviewed in [employer]  record.  ROS  The following are not active complaints unless bolded Hoarseness, sore throat, dysphagia, dental problems, itching, sneezing,  nasal congestion or discharge of excess mucus or purulent secretions, ear ache,   fever, chills, sweats, unintended wt loss or wt gain, classically pleuritic or exertional cp,  orthopnea pnd or leg swelling, presyncope, palpitations, abdominal pain, anorexia, nausea, vomiting, diarrhea  or change in bowel habits or change in bladder habits, change in stools or change in urine, dysuria, hematuria,  rash, arthralgias, visual complaints, headache, numbness, weakness or ataxia or problems with walking or coordination,  change in mood/affect or memory.        Current Meds  Medication Sig  . albuterol (PROVENTIL HFA;VENTOLIN HFA) 108 (90 BASE) MCG/ACT inhaler Inhale 1-2 puffs into the lungs every 6 (six) hours as needed for wheezing or shortness of breath.  Marland Kitchen aspirin 81 MG tablet Take 81 mg by mouth. Take 1 tablet every other day by mouth  . budesonide (PULMICORT) 0.5 MG/2ML nebulizer solution Take 0.5 mg by nebulization 2 (two) times daily.  . calcium citrate-vitamin D 500-400 MG-UNIT chewable tablet Chew 1 tablet by mouth daily.  Marland Kitchen CALCIUM PO Take 1 tablet by mouth daily.  . Cholecalciferol (VITAMIN D-3) 5000 units TABS Take 1 tablet by mouth daily.  Marland Kitchen  Cyanocobalamin (B-12 PO) Take 1 capsule by mouth daily.  . fluticasone (FLONASE) 50 MCG/ACT nasal spray Place 2 sprays into both nostrils daily as needed.   . formoterol (PERFOROMIST) 20 MCG/2ML nebulizer solution Take 20 mcg by nebulization 2 (two) times daily.  Marland Kitchen. levothyroxine (SYNTHROID, LEVOTHROID) 75 MCG tablet Take 75 mcg by mouth daily before breakfast.  . loratadine (CLARITIN) 10 MG tablet Take 10 mg by mouth daily.  Marland Kitchen. MAGNESIUM PO Take 1 tablet by mouth daily.  . Misc Natural Products (DAILY HERBS VISION HEALTH PO) Take 1 tablet by mouth daily.  . montelukast (SINGULAIR) 10 MG tablet Take 10 mg by mouth  at bedtime.  . Multiple Vitamin (MULTIVITAMIN) capsule Take 1 capsule by mouth daily.  Marland Kitchen. omeprazole (PRILOSEC) 20 MG capsule Take 20 mg by mouth daily.  . OXYGEN 2lpm with rest and 3lpm with heavy exertion  . PARoxetine (PAXIL-CR) 37.5 MG 24 hr tablet Take 37.5 mg by mouth daily.  . pravastatin (PRAVACHOL) 20 MG tablet Take 20 mg by mouth daily.  Marland Kitchen. telmisartan (MICARDIS) 40 MG tablet Take 40 mg by mouth daily.  Marland Kitchen. umeclidinium bromide (INCRUSE ELLIPTA) 62.5 MCG/INH AEPB Inhale 1 puff into the lungs daily.           Review of Systems     Objective:   Physical Exam    amb pleasant wf nad  Wt Readings from Last 3 Encounters:  01/03/17 166 lb 12.8 oz (75.7 kg)  10/20/14 183 lb 6.4 oz (83.2 kg)  04/14/14 182 lb 9.6 oz (82.8 kg)    Vital signs reviewed  - Note on arrival 02 sats  96% on 2lpm      HEENT: nl dentition, turbinates bilaterally, and oropharynx. Nl external ear canals without cough reflex   NECK :  without JVD/Nodes/TM/ nl carotid upstrokes bilaterally   LUNGS: no acc muscle use,  Nl contour chest with diminished breath sounds bilaterally and no wheeze audible / hyperresonant to percussion.   CV:  RRR  no s3 or murmur or increase in P2, and no edema   ABD:  soft and nontender with nl inspiratory excursion in the supine position. No bruits or organomegaly appreciated, bowel sounds nl  MS:  Nl gait/ ext warm without deformities, calf tenderness, cyanosis or clubbing No obvious joint restrictions   SKIN: warm and dry without lesions    NEURO:  alert, approp, nl sensorium with  no motor or cerebellar deficits apparent.    CXR PA and Lateral:   01/03/2017 :    I personally reviewed images and agree with radiology impression as follows:    Marland Kitchen. Hyperaeration may indicate emphysema.  No active lung disease.   Assessment:

## 2017-01-03 NOTE — Patient Instructions (Addendum)
Continue your present inhalers  Only use your albuterol as a rescue medication to be used if you can't catch your breath by resting or doing a relaxed purse lip breathing pattern.  - The less you use it, the better it will work when you need it. - Ok to use up to 2 puffs  every 4 hours if you must but call for immediate appointment if use goes up over your usual need - Don't leave home without it !!  (think of it like the spare tire for your car)   Continue 2lpm at bedtime and with activities where your 02 drops below 90% by your pulse oximeter- this will depend on how far and fast you plan to walk but pace yourself or use enough 02 to keep above 90% at all times   Please remember to go to the  x-ray department downstairs in the basement  for your tests - we will call you with the results when they are available.      Please schedule a follow up visit in 6 months but call sooner if needed

## 2017-01-04 ENCOUNTER — Telehealth: Payer: Self-pay | Admitting: Internal Medicine

## 2017-01-04 DIAGNOSIS — J9611 Chronic respiratory failure with hypoxia: Secondary | ICD-10-CM | POA: Insufficient documentation

## 2017-01-04 NOTE — Telephone Encounter (Signed)
Notes recorded by Wert, Michael B, MD on 01/04/2017 Nyoka Cowdenat 10:26 AM EDT Call pt: Reviewed cxr and no acute change so [facility]       Advised pt of results. Pt understood and nothing further is needed.

## 2017-01-04 NOTE — Assessment & Plan Note (Addendum)
01/03/2017   Walked RA  2 laps @ 185 ft each stopped due to  desat to 85% on second lap, corrected on 2lpm pulsed on 3rd at slow pace  Does not necessarily need 02 at rest but should use 2lpm hs and titrate during the day with goal of > 90% at all times

## 2017-01-04 NOTE — Progress Notes (Signed)
LMTCB

## 2017-01-04 NOTE — Assessment & Plan Note (Signed)
Spirometry 01/03/2017  FEV1 0.60 (25%)  Ratio 47 with classic curvature     Group D in terms of symptom/risk and laba/lama/ICS  therefore appropriate rx at this point which she is doing already with bud/formoterol/ incruse  Formulary restrictions will be an ongoing challenge for the forseable future and I would be happy to pick an alternative if the pt will first  provide me a list of them but pt  will need to return here for training for any new device that is required eg dpi vs hfa vs respimat.    In meantime we can always provide samples so the patient never runs out of any needed respiratory medications.   Total time devoted to counseling  > 50 % of initial 60 min office visit:  review case with pt/ discussion of options/alternatives/ personally creating written customized instructions  in presence of pt  then going over those specific  Instructions directly with the pt including how to use all of the meds but in particular covering each new medication in detail and the difference between the maintenance= "automatic" meds and the prns using an action plan format for the latter (If this problem/symptom => do that organization reading Left to right).  Please see AVS from this visit for a full list of these instructions which I personally wrote for this pt and  are unique to this visit.

## 2017-01-08 NOTE — Progress Notes (Signed)
ATC, someone answered the phone but did not speak, WCB later

## 2017-01-09 NOTE — Progress Notes (Signed)
Spoke with pt and notified of results per Dr. Wert. Pt verbalized understanding and denied any questions. 

## 2017-07-03 ENCOUNTER — Ambulatory Visit: Payer: Medicare HMO | Admitting: Internal Medicine

## 2017-07-16 ENCOUNTER — Ambulatory Visit: Payer: Medicare HMO | Admitting: Internal Medicine

## 2017-07-16 ENCOUNTER — Encounter: Payer: Self-pay | Admitting: Internal Medicine

## 2017-07-16 VITALS — BP 128/74 | HR 90 | Ht 65.25 in | Wt 174.0 lb

## 2017-07-16 DIAGNOSIS — J449 Chronic obstructive pulmonary disease, unspecified: Secondary | ICD-10-CM | POA: Diagnosis not present

## 2017-07-16 DIAGNOSIS — J9611 Chronic respiratory failure with hypoxia: Secondary | ICD-10-CM | POA: Diagnosis not present

## 2017-07-16 NOTE — Patient Instructions (Signed)
Plan A = Automatic = Incruse / perfomist/ budesonide as you are   Work on inhaler technique:  relax and gently blow all the way out then take a nice smooth deep breath back in, triggering the inhaler at same time you start breathing in.   Blow out thru nose. Rinse and gargle with water when done    Plan B = Backup Only use your albuterol as a rescue medication to be used if you can't catch your breath by resting or doing a relaxed purse lip breathing pattern.  - The less you use it, the better it will work when you need it. - Ok to use the inhaler up to 2 puffs  every 4 hours if you must but call for appointment if use goes up over your usual need - Don't leave home without it !!  (think of it like the spare tire for your car)    Plan C = Crisis - only use your albuterol nebulizer if you first try Plan B and it fails to help > ok to use the nebulizer up to every 4 hours but if start needing it regularly call for immediate appointment   Please schedule a follow up visit in 6  months but call sooner if needed

## 2017-07-16 NOTE — Progress Notes (Signed)
Subjective:     Patient ID: Teresa Jenkins, female   DOB: [birth year],     MRN: [id number]  HPI  45 yowf  [place] moved to [place] in 1970s quit smoking in 1995 with breathing bad to worse with h/o exacerbations she related to work exposure and lots of rhinitis which has persisted  eval by Chad in [place] dx mold and prev under care of Dr Blenda Nicely, Delford Field then [facility] but referred to pulmonary clinic 01/03/2017 by Dr   Martha Clan for copd eval.  Alpha one neg per [facility]    01/03/2017 1st [place] [facility] office visit/ Drenda Sobecki   Chief Complaint  Patient presents with  . Pulmonary Consult    Referred by Dr. Arlan Organ. Pt states she was dxed with COPD approx 21 years ago. She states her PCP rec that she f/u with pulm again. She has been seen here in the past by Dr Delford Field- last in 2016.  She states that today her breathing is "not too bad". She does get SOB with exertion such as grocery shopping, and she will not walk to her mailbox anymore due to having to walk up hill.    doe on best days = [employer] / [employer] ok leaning on cart on 2lpm / also 2lpm hs  = MMRC2 = can't walk a nl pace on a flat grade s sob but does fine slow and flat eg shopping leaning on cart   Presently maint on bud/ performist / incruse Peak wt 190  rec Continue your present inhalers Only use your albuterol as a rescue medication  Continue 2lpm at bedtime and with activities where your 02 drops below 90% by your pulse oximeter- this will depend on how far and fast you plan to walk but pace yourself or use enough 02 to keep above 90% at all times     07/16/2017  f/u ov/[name] re:    GOLD IV/ 02 dep at hs and with activity on perf/bud/ incruse  Chief Complaint  Patient presents with  . Follow-up    Breathing is doing well today.  She is using her ventolin 2 x daily on average.   Dyspnea:  [employer] ok on 3lpm = MMRC2 = can't walk a nl pace on a flat grade s sob but does fine slow and flat  Cough: no Sleep: ok 2lpm SABA use:   2 x weekly   No obvious day to day or daytime variability or assoc excess/ purulent sputum or mucus plugs or hemoptysis or cp or chest tightness, subjective wheeze or overt sinus or hb symptoms. No unusual exposure hx or h/o childhood pna/ asthma or knowledge of premature birth.  Sleeping  Ok on 2lpm 1 pillow   without nocturnal  or early am exacerbation  of respiratory  c/o's or need for noct saba. Also denies any obvious fluctuation of symptoms with weather or environmental changes or other aggravating or alleviating factors except as outlined above   Current Allergies, Complete Past Medical History, Past Surgical History, Family History, and Social History were reviewed in [employer] record.  ROS  The following are not active complaints unless bolded Hoarseness, sore throat, dysphagia, dental problems, itching, sneezing,  nasal congestion or discharge of excess mucus or purulent secretions, ear ache,   fever, chills, sweats, unintended wt loss or wt gain, classically pleuritic or exertional cp,  orthopnea pnd or arm/hand swelling  or leg swelling, presyncope, palpitations, abdominal pain, anorexia, nausea, vomiting, diarrhea  or  change in bowel habits or change in bladder habits, change in stools or change in urine, dysuria, hematuria,  rash, arthralgias, visual complaints, headache, numbness, weakness or ataxia or problems with walking or coordination,  change in mood or  memory.        Current Meds  Medication Sig  . albuterol (PROVENTIL HFA;VENTOLIN HFA) 108 (90 BASE) MCG/ACT inhaler Inhale 1-2 puffs into the lungs every 6 (six) hours as needed for wheezing or shortness of breath.  . budesonide (PULMICORT) 0.5 MG/2ML nebulizer solution Take 0.5 mg by nebulization 2 (two) times daily.  . calcium citrate-vitamin D 500-400 MG-UNIT chewable tablet Chew 1 tablet by mouth daily.  . Cholecalciferol (VITAMIN D-3) 5000 units TABS Take 1 tablet by mouth daily.  . Cyanocobalamin  (B-12 PO) Take 1 capsule by mouth daily.  . fluticasone (FLONASE) 50 MCG/ACT nasal spray Place 2 sprays into both nostrils daily as needed.   . formoterol (PERFOROMIST) 20 MCG/2ML nebulizer solution Take 20 mcg by nebulization 2 (two) times daily.  Marland Kitchen levothyroxine (SYNTHROID, LEVOTHROID) 75 MCG tablet Take 75 mcg by mouth daily before breakfast.  . loratadine (CLARITIN) 10 MG tablet Take 10 mg by mouth daily.  Marland Kitchen MAGNESIUM PO Take 1 tablet by mouth daily.  . Misc Natural Products (DAILY HERBS VISION HEALTH PO) Take 1 tablet by mouth daily.  . montelukast (SINGULAIR) 10 MG tablet Take 10 mg by mouth at bedtime.  . Multiple Vitamin (MULTIVITAMIN) capsule Take 1 capsule by mouth daily.  Marland Kitchen omeprazole (PRILOSEC) 20 MG capsule Take 20 mg by mouth daily.  . OXYGEN 3lpm with exertion  . PARoxetine (PAXIL-CR) 37.5 MG 24 hr tablet Take 37.5 mg by mouth daily.  . pravastatin (PRAVACHOL) 20 MG tablet Take 20 mg by mouth daily.  Marland Kitchen telmisartan (MICARDIS) 40 MG tablet Take 40 mg by mouth daily.  Marland Kitchen umeclidinium bromide (INCRUSE ELLIPTA) 62.5 MCG/INH AEPB Inhale 1 puff into the lungs daily.                       Objective:   Physical Exam  amb pleasant wf nad   07/16/2017       174   01/03/17 166 lb 12.8 oz (75.7 kg)  10/20/14 183 lb 6.4 oz (83.2 kg)  04/14/14 182 lb 9.6 oz (82.8 kg)    Vital signs reviewed - Note on arrival 02 sats  100% on 3lpm pulsed      HEENT: nl dentition / oropharynx. Nl external ear canals without cough reflex - moderate bilateral non-specific turbinate edema     NECK :  without JVD/Nodes/TM/ nl carotid upstrokes bilaterally   LUNGS: no acc muscle use,  Mod barrel  contour chest wall with bilateral  Distant bs s audible wheeze and  without cough on insp or exp maneuver and mod   Hyperresonant  to  percussion bilaterally     CV:  RRR  no s3 or murmur or increase in P2, and no edema   ABD:  soft and nontender with pos mid insp Hoover's  in the supine position. No  bruits or organomegaly appreciated, bowel sounds nl  MS:   Nl gait/  ext warm without deformities, calf tenderness, cyanosis or clubbing No obvious joint restrictions   SKIN: warm and dry without lesions    NEURO:  alert, approp, nl sensorium with  no motor or cerebellar deficits apparent.            Assessment:

## 2017-07-17 ENCOUNTER — Encounter: Payer: Self-pay | Admitting: Internal Medicine

## 2017-07-17 NOTE — Assessment & Plan Note (Signed)
Spirometry 01/03/2017  FEV1 0.60 (25%)  Ratio 47 with classic curvature   - 07/16/2017  After extensive coaching inhaler device  effectiveness =    75% (short Ti)   Pt is Group B in terms of symptom/risk and laba/lama therefore appropriate rx at this point and really needs to focus on pacing herself better and using saba only if can't her breath.   Declined referral to rehab due to cost  I had an extended discussion with the patient reviewing all relevant studies completed to date and  lasting 15 to 20 minutes of a 25 minute visit    See device teaching which extended face to face time for this visit   Each maintenance medication was reviewed in detail including most importantly the difference between maintenance and prns and under what circumstances the prns are to be triggered using an action plan format that is not reflected in the computer generated alphabetically organized AVS.    Please see AVS for specific instructions unique to this visit that I personally wrote and verbalized to the the pt in detail and then reviewed with pt  by my nurse highlighting any  changes in therapy recommended at today's visit to their plan of care.

## 2017-07-17 NOTE — Assessment & Plan Note (Signed)
01/03/2017   Walked RA  2 laps @ 185 ft each stopped due to  desat to 85% on second lap, corrected on 2lpm pulsed on 3rd at slow pace  Advised goal is sat > 90% and no benefit to trying to drive it higher and then running out of 02 /battery power on POC before gets back home

## 2017-07-18 ENCOUNTER — Other Ambulatory Visit: Payer: Self-pay | Admitting: Internal Medicine

## 2018-01-16 ENCOUNTER — Ambulatory Visit: Payer: Medicare HMO | Admitting: Internal Medicine

## 2018-01-17 ENCOUNTER — Other Ambulatory Visit: Payer: Self-pay | Admitting: Internal Medicine

## 2018-01-17 MED ORDER — ALBUTEROL SULFATE HFA 108 (90 BASE) MCG/ACT IN AERS: INHALATION_SPRAY | RESPIRATORY_TRACT | 2 refills | Status: DC

## 2018-02-06 ENCOUNTER — Ambulatory Visit: Payer: Medicare HMO | Admitting: Internal Medicine

## 2018-03-13 ENCOUNTER — Encounter: Payer: Self-pay | Admitting: Internal Medicine

## 2018-03-13 ENCOUNTER — Ambulatory Visit: Payer: Medicare HMO | Admitting: Internal Medicine

## 2018-03-13 VITALS — BP 142/70 | HR 102 | Ht 65.25 in | Wt 176.0 lb

## 2018-03-13 DIAGNOSIS — J9611 Chronic respiratory failure with hypoxia: Secondary | ICD-10-CM

## 2018-03-13 DIAGNOSIS — M7989 Other specified soft tissue disorders: Secondary | ICD-10-CM | POA: Diagnosis not present

## 2018-03-13 DIAGNOSIS — J449 Chronic obstructive pulmonary disease, unspecified: Secondary | ICD-10-CM | POA: Diagnosis not present

## 2018-03-13 MED ORDER — CHLORTHALIDONE 25 MG PO TABS: 25.0000 mg | ORAL_TABLET | Freq: Every day | ORAL | 11 refills | Status: DC

## 2018-03-13 MED ORDER — FLUTICASONE-UMECLIDIN-VILANT 100-62.5-25 MCG/INH IN AEPB: 1.0000 | INHALATION_SPRAY | Freq: Every day | RESPIRATORY_TRACT | 0 refills | Status: DC

## 2018-03-13 MED ORDER — FLUTICASONE-UMECLIDIN-VILANT 100-62.5-25 MCG/INH IN AEPB: 1.0000 | INHALATION_SPRAY | Freq: Every day | RESPIRATORY_TRACT | 11 refills | Status: DC

## 2018-03-13 NOTE — Progress Notes (Signed)
Subjective:    Patient ID: Teresa Jenkins, female   DOB: 1945/10/21,     MRN: [id number]    Brief patient profile:  63 yowf  From [place] moved to [place] in 1970s quit smoking in 1995 with breathing bad to worse with h/o exacerbations she related to work exposure and lots of rhinitis which has persisted  eval by Teresa Jenkins in [place] dx mold and prev under care of Teresa Jenkins, Teresa Jenkins then Teresa Jenkins but referred to pulmonary clinic 01/03/2017 by Teresa   Teresa Jenkins for copd eval.  Alpha one neg per Teresa Jenkins     History of Present Illness  01/03/2017 1st [facility] office visit/ Teresa Jenkins   Chief Complaint  Patient presents with  . Pulmonary Consult    Referred by Teresa. Arlan Jenkins. Pt states she was dxed with COPD approx 21 years ago. She states her PCP rec that she f/u with pulm again. She has been seen here in the past by Teresa Teresa Jenkins- last in 2016.  She states that today her breathing is "not too bad". She does get SOB with exertion such as grocery shopping, and she will not walk to her mailbox anymore due to having to walk up hill.    doe on best days = [employer] / [employer] ok leaning on cart on 2lpm / also 2lpm hs  = MMRC2 = can't walk a nl pace on a flat grade s sob but does fine slow and flat eg shopping leaning on cart   Presently maint on bud/ performist / incruse Peak wt 190  rec Continue your present inhalers Only use your albuterol as a rescue medication  Continue 2lpm at bedtime and with activities where your 02 drops below 90% by your pulse oximeter- this will depend on how far and fast you plan to walk but pace yourself or use enough 02 to keep above 90% at all times     07/16/2017  f/u ov/Teresa Jenkins re:    GOLD IV/ 02 dep at hs and with activity on perf/bud/ incruse  Chief Complaint  Patient presents with  . Follow-up    Breathing is doing well today.  She is using her ventolin 2 x daily on average.   Dyspnea:  [employer] ok on 3lpm = MMRC2 = can't walk a nl pace on a flat grade s sob but does fine  slow and flat  Cough: no Sleep: ok 2lpm SABA use:  2 x weekly  rec Plan A = Automatic = Incruse / perfomist/ budesonide as you are  Work on inhaler technique:  relax and gently blow all the way out then take a nice smooth deep breath back in, triggering the inhaler at same time you start breathing in.   Blow out thru nose. Rinse and gargle with water when done Plan B = Backup Only use your albuterol as a rescue medication  Plan C = Crisis - only use your albuterol nebulizer if you first try Plan B and it fails to help > ok to use the nebulizer up to every 4 hours but if start needing it regularly call for immediate appointment    03/13/2018  f/u ov/Teresa Jenkins re:  GOLD IV copd/ new leg swelling  Chief Complaint  Patient presents with  . Follow-up    Today is a bad day for her breathing. She is having a hard time affording her nebs. She has noticed swelling in her ankles for the past month. She is using her rescue inhaler about 4  x per wk on average.   Dyspnea:  MMRC3 = can't walk 100 yards even at a slow pace at a flat grade s stopping due to sob  On 3 lpm POC  Cough: none Sleeping: ok flat SABA use: 4x week 02: 2lpm at  home and 3 POC 2   No obvious day to day or daytime variability or assoc excess/ purulent sputum or mucus plugs or hemoptysis or cp or chest tightness, subjective wheeze or overt sinus or hb symptoms.   Sleep as above without nocturnal  or early am exacerbation  of respiratory  c/o's or need for noct saba. Also denies any obvious fluctuation of symptoms with weather or environmental changes or other aggravating or alleviating factors except as outlined above   No unusual exposure hx or h/o childhood pna/ asthma or knowledge of premature birth.  Current Allergies, Complete Past Medical History, Past Surgical History, Family History, and Social History were reviewed in [employer] record.  ROS  The following are not active complaints unless  bolded Hoarseness, sore throat, dysphagia, dental problems, itching, sneezing,  nasal congestion or discharge of excess mucus or purulent secretions, ear ache,   fever, chills, sweats, unintended wt loss or wt gain, classically pleuritic or exertional cp,  orthopnea pnd or arm/hand swelling  or leg swelling x 1-2 months, presyncope, palpitations, abdominal pain, anorexia, nausea, vomiting, diarrhea  or change in bowel habits or change in bladder habits, change in stools or change in urine, dysuria, hematuria,  rash, arthralgias, visual complaints, headache, numbness, weakness or ataxia or problems with walking or coordination,  change in mood or  memory.        Current Meds  Medication Sig  . albuterol (VENTOLIN HFA) 108 (90 Base) MCG/ACT inhaler INHALE TWO PUFFS FOUR TIMES A DAY AS NEEDED FOR SHORTNESS OF BREATH  . budesonide (PULMICORT) 0.5 MG/2ML nebulizer solution Take 0.5 mg by nebulization 2 (two) times daily.  . calcium citrate-vitamin D 500-400 MG-UNIT chewable tablet Chew 1 tablet by mouth daily.  . Cholecalciferol (VITAMIN D-3) 5000 units TABS Take 1 tablet by mouth daily.  . Cyanocobalamin (B-12 PO) Take 1 capsule by mouth daily.  . fluticasone (FLONASE) 50 MCG/ACT nasal spray Place 2 sprays into both nostrils daily as needed.   . formoterol (PERFOROMIST) 20 MCG/2ML nebulizer solution Take 20 mcg by nebulization 2 (two) times daily.  Marland Kitchen levothyroxine (SYNTHROID, LEVOTHROID) 75 MCG tablet Take 75 mcg by mouth daily before breakfast.  . loratadine (CLARITIN) 10 MG tablet Take 10 mg by mouth daily.  Marland Kitchen MAGNESIUM PO Take 1 tablet by mouth daily.  . Misc Natural Products (DAILY HERBS VISION HEALTH PO) Take 1 tablet by mouth daily.  . montelukast (SINGULAIR) 10 MG tablet Take 10 mg by mouth at bedtime.  . Multiple Vitamin (MULTIVITAMIN) capsule Take 1 capsule by mouth daily.  Marland Kitchen omeprazole (PRILOSEC) 20 MG capsule Take 20 mg by mouth daily.  . OXYGEN 2lpm with rest and 3lpm with exertion  .  PARoxetine (PAXIL-CR) 37.5 MG 24 hr tablet Take 37.5 mg by mouth daily.  . pravastatin (PRAVACHOL) 20 MG tablet Take 20 mg by mouth daily.  Marland Kitchen telmisartan (MICARDIS) 40 MG tablet Take 40 mg by mouth daily.  Marland Kitchen umeclidinium bromide (INCRUSE ELLIPTA) 62.5 MCG/INH AEPB Inhale 1 puff into the lungs daily.  Objective:   Physical Example   Pleasant amb wf nad   03/13/2018          176  07/16/2017       174   01/03/17 166 lb 12.8 oz (75.7 kg)  10/20/14 183 lb 6.4 oz (83.2 kg)  04/14/14 182 lb 9.6 oz (82.8 kg)    Vital signs reviewed - Note on arrival 02 sats  96% on  3 POC       HEENT: nl dentition / oropharynx. Nl external ear canals without cough reflex -  Mild bilateral non-specific turbinate edema     NECK :  without JVD/Nodes/TM/ nl carotid upstrokes bilaterally   LUNGS: no acc muscle use,  Mod barrel  contour chest wall with bilateral  Distant bs s audible wheeze and  without cough on insp or exp maneuver and mod  Hyperresonant  to  percussion bilaterally     CV:  RRR  no s3 or murmur or increase in P2, and 1+ bilateral LE  edema   ABD:  soft and nontender with pos mid insp Hoover's  in the supine position. No bruits or organomegaly appreciated, bowel sounds nl  MS:   Nl gait/  ext warm without deformities, calf tenderness, cyanosis or clubbing No obvious joint restrictions   SKIN: warm and dry without lesions    NEURO:  alert, approp, nl sensorium with  no motor or cerebellar deficits apparent.                   Assessment:

## 2018-03-13 NOTE — Patient Instructions (Addendum)
Plan A = Automatic = Trelegy one click each am - take two good drags   Plan B = Backup Only use your albuterol inhaler as a rescue medication to be used if you can't catch your breath by resting or doing a relaxed purse lip breathing pattern.  - The less you use it, the better it will work when you need it. - Ok to use the inhaler up to 2 puffs  every 4 hours if you must but call for appointment if use goes up over your usual need - Don't leave home without it !!  (think of it like the spare tire for your car)   Plan C = Crisis - only use your albuterol nebulizer if you first try Plan B and it fails to help > ok to use the nebulizer up to every 4 hours but if start needing it regularly call for immediate appointment   Add chlorthalidone 25 mg one daily    Please schedule a follow up visit in 6  months but call sooner if needed Late add needs ono on 2lpm

## 2018-03-14 ENCOUNTER — Encounter: Payer: Self-pay | Admitting: Internal Medicine

## 2018-03-14 ENCOUNTER — Telehealth: Payer: Self-pay | Admitting: *Deleted

## 2018-03-14 DIAGNOSIS — M7989 Other specified soft tissue disorders: Secondary | ICD-10-CM | POA: Insufficient documentation

## 2018-03-14 DIAGNOSIS — J9611 Chronic respiratory failure with hypoxia: Secondary | ICD-10-CM

## 2018-03-14 NOTE — Assessment & Plan Note (Signed)
01/03/2017   Walked RA  2 laps @ 185 ft each stopped due to  desat to 85% on second lap, corrected on 2lpm pulsed on 3rd at slow pace   Adequate control on present rx, reviewed in detail with pt > no change in rx needed  = 2lpm at home and 3lpm POC when out  > rec check sats with activity to be sure it's enough as may be developing cor pulmonale   Also needs ono on 2lpm

## 2018-03-14 NOTE — Assessment & Plan Note (Signed)
New onset 01/2018  - added chlorthalidone 03/13/2018 25 mg daily   May be develping cor pulmonale and has cpx planned soon so will defer labs but start low dose hctz and check ono on 2lpm     I had an extended discussion with the patient reviewing all relevant studies completed to date and  lasting 15 to 20 minutes of a 25 minute visit    Each maintenance medication was reviewed in detail including most importantly the difference between maintenance and prns and under what circumstances the prns are to be triggered using an action plan format that is not reflected in the computer generated alphabetically organized AVS.     Please see AVS for specific instructions unique to this visit that I personally wrote and verbalized to the the pt in detail and then reviewed with pt  by my nurse highlighting any  changes in therapy recommended at today's visit to their plan of care.

## 2018-03-14 NOTE — Telephone Encounter (Signed)
Spoke with the pt and notified of recs per MW  She verbalized understanding  Order sent to [facility] 

## 2018-03-14 NOTE — Telephone Encounter (Signed)
-----   Message from Nyoka Cowden, MD sent at 03/14/2018  5:21 AM EST ----- Needs ono on 2lpm

## 2018-03-14 NOTE — Assessment & Plan Note (Signed)
Quit smoking 1995 Alpha one neg per Chodri  Spirometry 01/03/2017  FEV1 0.60 (25%)  Ratio 47 with classic curvature   - 07/16/2017 declined rehab due to cost - 03/13/2018  After extensive coaching inhaler device,  effectiveness =    90% with elipta > try change to trelegy   Can no longer afford perf/bud neb but her insurance is paying for incruse so try trelegy one click daily as her single rx and use saba as backup   Advised:  formulary restrictions will be an ongoing challenge for the forseable future and I would be happy to pick an alternative if the pt will first  provide me a list of them -  pt  will need to return here for training for any new device that is required eg dpi vs hfa vs respimat.    In the meantime we can always provide samples so that the patient never runs out of any needed respiratory medications.

## 2018-03-21 ENCOUNTER — Telehealth: Payer: Self-pay | Admitting: Internal Medicine

## 2018-03-21 NOTE — Telephone Encounter (Signed)
Pt calling about ONO that has already been ordered Will forward to [facility] per protocol

## 2018-03-21 NOTE — Telephone Encounter (Signed)
Pt is calling back [phone]

## 2018-03-21 NOTE — Telephone Encounter (Signed)
LM on Jason w/ [facility]'s VM to get an update on this patient.  I contacted the pt & let her know that the ONO order was sent to [facility] & that they will be contacting the patient to schedule this appt.  I let pt know that they would need to obtain authorization information from her insurance & that they will contact the pt to schedule.  Pt verbalized understanding & stated nothing further needed at this time.

## 2018-03-21 NOTE — Telephone Encounter (Signed)
I called Barbara Cower at [facility] to see if they tried to reach out to pt. He did not answer, LM for him to call back.   ATC pt, no answer. Left message for pt to call back.

## 2018-03-29 ENCOUNTER — Telehealth: Payer: Self-pay | Admitting: Internal Medicine

## 2018-03-29 NOTE — Telephone Encounter (Signed)
Spoke with pt. I have gone over her AVS instructions from 03/13/2018 when she saw Dr. Sherene Sires. Advised her that she should be taking Trelegy. Nothing further was needed at this time.

## 2018-04-22 ENCOUNTER — Telehealth: Payer: Self-pay | Admitting: Internal Medicine

## 2018-04-22 NOTE — Telephone Encounter (Signed)
Left message for patient to call back  

## 2018-04-23 NOTE — Telephone Encounter (Signed)
Patient state she Is coughing up mucus and in her mucus there is blood.For 3days but has had none today yet. I offered an appointment today and she dined this but will be seen tomorrow at 4:30 by MW.

## 2018-04-24 ENCOUNTER — Encounter: Payer: Self-pay | Admitting: Internal Medicine

## 2018-04-24 ENCOUNTER — Ambulatory Visit: Payer: Medicare HMO | Admitting: Internal Medicine

## 2018-04-24 ENCOUNTER — Ambulatory Visit (INDEPENDENT_AMBULATORY_CARE_PROVIDER_SITE_OTHER)
Admission: RE | Admit: 2018-04-24 | Discharge: 2018-04-24 | Disposition: A | Discharge status: Home / Self Care | Payer: Medicare HMO | Source: Ambulatory Visit | Attending: Internal Medicine | Admitting: Internal Medicine

## 2018-04-24 VITALS — BP 152/70 | HR 104 | Temp 98.4°F | Ht 65.25 in | Wt 174.0 lb

## 2018-04-24 DIAGNOSIS — J9611 Chronic respiratory failure with hypoxia: Secondary | ICD-10-CM | POA: Diagnosis not present

## 2018-04-24 DIAGNOSIS — J449 Chronic obstructive pulmonary disease, unspecified: Secondary | ICD-10-CM

## 2018-04-24 MED ORDER — AZITHROMYCIN 250 MG PO TABS: ORAL_TABLET | ORAL | 0 refills | Status: DC

## 2018-04-24 MED ORDER — PREDNISONE 10 MG PO TABS: ORAL_TABLET | ORAL | 0 refills | Status: DC

## 2018-04-24 NOTE — Patient Instructions (Addendum)
When coughing >  prilosec 20mg  Take 30- 60 min before your first and last meals of the day   Best cough medication > mucinex dm 1200 mg every 12 hours   zpak  Prednisone 10 mg take  4 each am x 2 days,   2 each am x 2 days,  1 each am x 2 days and stop    GERD (REFLUX)  is an extremely common cause of respiratory symptoms just like yours , many times with no obvious heartburn at all.    It can be treated with medication, but also with lifestyle changes including elevation of the head of your bed (ideally with 6 -8inch blocks under the headboard of your bed),  Smoking cessation, avoidance of late meals, excessive alcohol, and avoid fatty foods, chocolate, peppermint, colas, red wine, and acidic juices such as orange juice.  NO MINT OR MENTHOL PRODUCTS SO NO COUGH DROPS  USE SUGARLESS CANDY INSTEAD (Jolley ranchers or Stover's or Life Savers) or even ice chips will also do - the key is to swallow to prevent all throat clearing. NO OIL BASED VITAMINS - use powdered substitutes.  Avoid fish oil when coughing.    Please remember to go to the  x-ray department  for your tests - we will call you with the results when they are available     Call if not improving by end of wek

## 2018-04-24 NOTE — Progress Notes (Signed)
Subjective:    Patient ID: Teresa Jenkins, female   DOB: 1945/10/21,     MRN: [id number]    Brief patient profile:  63 yowf  From [place] moved to [place] in 1970s quit smoking in 1995 with breathing bad to worse with h/o exacerbations she related to work exposure and lots of rhinitis which has persisted  eval by Chad in [place] dx mold and prev under care of Dr Blenda Nicely, Delford Field then [facility] but referred to pulmonary clinic 01/03/2017 by Dr   Martha Clan for copd eval.  Alpha one neg per Chodri     History of Present Illness  01/03/2017 1st [facility] office visit/ Teresa Jenkins   Chief Complaint  Patient presents with  . Pulmonary Consult    Referred by Dr. Arlan Organ. Pt states she was dxed with COPD approx 21 years ago. She states her PCP rec that she f/u with pulm again. She has been seen here in the past by Dr Delford Field- last in 2016.  She states that today her breathing is "not too bad". She does get SOB with exertion such as grocery shopping, and she will not walk to her mailbox anymore due to having to walk up hill.    doe on best days = [employer] ok leaning on cart on 2lpm / also 2lpm hs  = MMRC2 = can't walk a nl pace on a flat grade s sob but does fine slow and flat eg shopping leaning on cart   Presently maint on bud/ performist / incruse Peak wt 190  rec Continue your present inhalers Only use your albuterol as a rescue medication  Continue 2lpm at bedtime and with activities where your 02 drops below 90% by your pulse oximeter- this will depend on how far and fast you plan to walk but pace yourself or use enough 02 to keep above 90% at all times     07/16/2017  f/u ov/Teresa Jenkins re:    GOLD IV/ 02 dep at hs and with activity on perf/bud/ incruse  Chief Complaint  Patient presents with  . Follow-up    Breathing is doing well today.  She is using her ventolin 2 x daily on average.   Dyspnea:  [employer] ok on 3lpm = MMRC2 = can't walk a nl pace on a flat grade s sob but does fine  slow and flat  Cough: no Sleep: ok 2lpm SABA use:  2 x weekly  rec Plan A = Automatic = Incruse / perfomist/ budesonide as you are  Work on inhaler technique:  relax and gently blow all the way out then take a nice smooth deep breath back in, triggering the inhaler at same time you start breathing in.   Blow out thru nose. Rinse and gargle with water when done Plan B = Backup Only use your albuterol as a rescue medication  Plan C = Crisis - only use your albuterol nebulizer if you first try Plan B and it fails to help > ok to use the nebulizer up to every 4 hours but if start needing it regularly call for immediate appointment    03/13/2018  f/u ov/Teresa Jenkins re:  GOLD IV copd/ new leg swelling  Chief Complaint  Patient presents with  . Follow-up    Today is a bad day for her breathing. She is having a hard time affording her nebs. She has noticed swelling in her ankles for the past month. She is using her rescue inhaler about 4  x per wk on average.   Dyspnea:  MMRC3 = can't walk 100 yards even at a slow pace at a flat grade s stopping due to sob  On 3 lpm POC  Cough: none Sleeping: ok flat SABA use: 4x week 02: 2lpm at  home and 3 POC 2 rec Plan A = Automatic = Trelegy one click each am - take two good drags  Plan B = Backup Only use your albuterol inhaler as a rescue medication  Plan C = Crisis - only use your albuterol nebulizer if you first try Plan B  Add chlorthalidone 25 mg one daily    04/24/2018 acute extended ov/Teresa Jenkins re:  Bloody cough in setting of GOLD IV copd / 02 dep  Chief Complaint  Patient presents with  . Acute Visit    Pt c/o hemoptysis x 2 days. She states she has felt more SOB today.   acute 04/12/18 chills/ fever/ cough ha breathing worse than usual but never seen  Fever better p one day / never produced anything  Until "bleeding started" but total amt of blood is < a tsp mixed with mp secretions.   did not try saba yet for sob    No obvious day to day or daytime  variability or assoc  mucus plugs or  cp or chest tightness, subjective wheeze or overt sinus or hb symptoms.    Also denies any obvious fluctuation of symptoms with weather or environmental changes or other aggravating or alleviating factors except as outlined above   No unusual exposure hx or h/o childhood pna/ asthma or knowledge of premature birth.  Current Allergies, Complete Past Medical History, Past Surgical History, Family History, and Social History were reviewed in [employer] record.  ROS  The following are not active complaints unless bolded Hoarseness, sore throat, dysphagia, dental problems, itching, sneezing,  nasal congestion or discharge of excess mucus or purulent secretions, ear ache,   fever, chills, sweats, unintended wt loss or wt gain, classically pleuritic or exertional cp,  orthopnea pnd or arm/hand swelling  or leg swelling, presyncope, palpitations, abdominal pain, anorexia, nausea, vomiting, diarrhea  or change in bowel habits or change in bladder habits, change in stools or change in urine, dysuria, hematuria,  rash, arthralgias, visual complaints, headache, numbness, weakness or ataxia or problems with walking or coordination,  change in mood or  memory.        Current Meds  Medication Sig  . albuterol (VENTOLIN HFA) 108 (90 Base) MCG/ACT inhaler INHALE TWO PUFFS FOUR TIMES A DAY AS NEEDED FOR SHORTNESS OF BREATH  . calcium citrate-vitamin D 500-400 MG-UNIT chewable tablet Chew 1 tablet by mouth daily.  . cetirizine (ZYRTEC) 10 MG tablet Take 10 mg by mouth daily.  . chlorthalidone (HYGROTON) 25 MG tablet Take 1 tablet (25 mg total) by mouth daily.  . Cholecalciferol (VITAMIN D-3) 5000 units TABS Take 1 tablet by mouth daily.  . Cyanocobalamin (B-12 PO) Take 1 capsule by mouth daily.  . fluticasone (FLONASE) 50 MCG/ACT nasal spray Place 2 sprays into both nostrils daily as needed.   . Fluticasone-Umeclidin-Vilant (TRELEGY ELLIPTA) 100-62.5-25  MCG/INH AEPB Inhale 1 puff into the lungs daily.  Marland Kitchen levothyroxine (SYNTHROID, LEVOTHROID) 75 MCG tablet Take 75 mcg by mouth daily before breakfast.  . MAGNESIUM PO Take 1 tablet by mouth daily.  . Misc Natural Products (DAILY HERBS VISION HEALTH PO) Take 1 tablet by mouth daily.  . montelukast (SINGULAIR) 10 MG tablet Take 10 mg  by mouth at bedtime.  . Multiple Vitamin (MULTIVITAMIN) capsule Take 1 capsule by mouth daily.  Marland Kitchen omeprazole (PRILOSEC) 20 MG capsule Take 20 mg by mouth daily.  . OXYGEN 2lpm with rest and 3lpm with exertion  . PARoxetine (PAXIL-CR) 37.5 MG 24 hr tablet Take 37.5 mg by mouth daily.  . pravastatin (PRAVACHOL) 20 MG tablet Take 20 mg by mouth daily.  Marland Kitchen telmisartan (MICARDIS) 40 MG tablet Take 40 mg by mouth daily.              Objective:   Physical Exam    Hoarse amb wf nad  04/24/2018        174 03/13/2018          176  07/16/2017       174   01/03/17 166 lb 12.8 oz (75.7 kg)  10/20/14 183 lb 6.4 oz (83.2 kg)  04/14/14 182 lb 9.6 oz (82.8 kg)    Vital signs reviewed - Note on arrival 02 sats  94% on 3lpm POC      HEENT: nl dentition / oropharynx. Nl external ear canals without cough reflex -  Mild bilateral non-specific turbinate edema     NECK :  without JVD/Nodes/TM/ nl carotid upstrokes bilaterally   LUNGS: no acc muscle use,  Mod barrel  contour chest wall with bilateral  Distant bs s audible wheeze and  without cough on insp or exp maneuver and mod  Hyperresonant  to  percussion bilaterally     CV:  RRR  no s3 or murmur or increase in P2, and  Minimal LE pitting edema   ABD:  soft and nontender with pos insp Hoover's  in the supine position. No bruits or organomegaly appreciated, bowel sounds nl  MS:   Nl gait/  ext warm without deformities, calf tenderness, cyanosis or clubbing No obvious joint restrictions   SKIN: warm and dry without lesions    NEURO:  alert, approp, nl sensorium with  no motor or cerebellar deficits apparent.               CXR PA and Lateral:   04/24/2018 :    I personally reviewed images and   impression as follows:    severe copd changes with mod severe kyphosis / no acute changes        Assessment:

## 2018-04-25 ENCOUNTER — Encounter: Payer: Self-pay | Admitting: Internal Medicine

## 2018-04-25 NOTE — Assessment & Plan Note (Signed)
01/03/2017   Walked RA  2 laps @ 185 ft each stopped due to  desat to 85% on second lap, corrected on 2lpm pulsed on 3rd at slow pace   Despite aecopd no change in 02 needs

## 2018-04-25 NOTE — Progress Notes (Signed)
Spoke with pt and notified of results per Dr. Wert. Pt verbalized understanding and denied any questions. 

## 2018-04-25 NOTE — Assessment & Plan Note (Addendum)
Quit smoking 1995 Alpha one neg per Chodri  Spirometry 01/03/2017  FEV1 0.60 (25%)  Ratio 47 with classic curvature   - 07/16/2017 declined rehab due to cost - 03/13/2018    try change to trelegy   - The proper method of use, as well as anticipated side effects, of a metered-dose inhaler are discussed and demonstrated to the patient for both dpi and hfa    Acute exac as with tracheobronchitis / hoarseness / hemoptysis. Had been doing well on trelegy though not rinsing and gargling and had developed hoarseness and dry cough prior to the acute flare so rec  1)  Zpak/ pred for flare  2)  Rinse and gargle p brush teeth with arm and hammer tootpaste 3) max rx for gerd when coughing/ mucinex dm prn 4) if not improving will need change to symb/spiriva smi 5) if better keep prev appt   I had an extended discussion with the patient reviewing all relevant studies completed to date and  lasting 15 to 20 minutes of a 25 minute acute office visit    See device teaching which extended face to face time for this visit.  Each maintenance medication was reviewed in detail including emphasizing most importantly the difference between maintenance and prns and under what circumstances the prns are to be triggered using an action plan format that is not reflected in the computer generated alphabetically organized AVS which I have not found useful in most complex patients, especially with respiratory illnesses  Please see AVS for specific instructions unique to this visit that I personally wrote and verbalized to the the pt in detail and then reviewed with pt  by my nurse highlighting any  changes in therapy recommended at today's visit to their plan of care.

## 2018-09-11 ENCOUNTER — Ambulatory Visit: Payer: Medicare HMO | Admitting: Internal Medicine

## 2018-11-05 ENCOUNTER — Ambulatory Visit: Payer: Medicare HMO | Admitting: Internal Medicine

## 2018-11-24 ENCOUNTER — Other Ambulatory Visit: Payer: Self-pay | Admitting: Internal Medicine

## 2018-11-29 ENCOUNTER — Telehealth: Payer: Self-pay | Admitting: Internal Medicine

## 2018-11-29 NOTE — Telephone Encounter (Signed)
Called and spoke with Patient. Patient stated she had a fall and was taken to the ED at [facility].  Patient stated ED provider told her she was having a COPD exacerbation.  Patient stated she was given meds, prescription, and discharged home.  Patient stated she has hospital follow up 12/10/18, with MW. Patient stated she contacted [facility] records and was told to have someone call them Monday morning, [phone], and Sharyn Lull in medical records will send requested ED notes.  Will route message to Dr. Melvyn Novas

## 2018-11-30 NOTE — Telephone Encounter (Signed)
Fine with me

## 2018-12-02 ENCOUNTER — Ambulatory Visit: Payer: Medicare HMO | Admitting: Internal Medicine

## 2018-12-10 ENCOUNTER — Ambulatory Visit: Payer: Medicare HMO | Admitting: Internal Medicine

## 2018-12-11 ENCOUNTER — Encounter: Payer: Self-pay | Admitting: Internal Medicine

## 2018-12-11 ENCOUNTER — Ambulatory Visit (INDEPENDENT_AMBULATORY_CARE_PROVIDER_SITE_OTHER): Payer: Medicare HMO | Admitting: Internal Medicine

## 2018-12-11 ENCOUNTER — Other Ambulatory Visit: Payer: Self-pay

## 2018-12-11 DIAGNOSIS — J449 Chronic obstructive pulmonary disease, unspecified: Secondary | ICD-10-CM | POA: Diagnosis not present

## 2018-12-11 DIAGNOSIS — J9611 Chronic respiratory failure with hypoxia: Secondary | ICD-10-CM | POA: Diagnosis not present

## 2018-12-11 MED ORDER — OMEPRAZOLE 20 MG PO CPDR: DELAYED_RELEASE_CAPSULE | ORAL | 11 refills | Status: DC

## 2018-12-11 MED ORDER — PREDNISONE 10 MG PO TABS: ORAL_TABLET | ORAL | 0 refills | Status: DC

## 2018-12-11 MED ORDER — ALBUTEROL SULFATE (2.5 MG/3ML) 0.083% IN NEBU: 2.5000 mg | INHALATION_SOLUTION | RESPIRATORY_TRACT | 0 refills | Status: DC | PRN

## 2018-12-11 NOTE — Patient Instructions (Addendum)
Plan A = Automatic= Always = Trelegy one click each am - take two good deep drags then rinse and gargle immediately to keep the powder out of your throat   Add Prilosec 20 mg Take 30- 60 min before your first and last meals of the day   Prednisone 10 mg take  4 each am x 2 days,   2 each am x 2 days,  1 each am x 2 days and stop   Plan B = Backup Only use your albuterol inhaler as a rescue medication to be used if you can't catch your breath by resting or doing a relaxed purse lip breathing pattern.  - The less you use it, the better it will work when you need it. - Ok to use the inhaler up to 2 puffs  every 4 hours if you must but call for appointment if use goes up over your usual need - Don't leave home without it !!  (think of it like the spare tire for your car)   Plan C = Crisis - only use your albuterol nebulizer (not the one with ipatropium in it)  if you first try Plan B and it fails to help > ok to use the nebulizer up to every 4 hours but if start needing it regularly call for immediate appointment  For cough  >  mucinex dm up to 1200 mg every 12 hours as needed   Please schedule a follow up office visit in 2 weeks, sooner if needed  with all medications /inhalers/ solutions in hand so we can verify exactly what you are taking. This includes all medications from all doctors and over the counters
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
## 2018-12-11 NOTE — Assessment & Plan Note (Signed)
Quit smoking 1995 Alpha one neg per Chodri  Spirometry 01/03/2017  FEV1 0.60 (25%)  Ratio 47 with classic curvature   - 07/16/2017 declined rehab due to cost - 03/13/2018  After extensive coaching inhaler device,  effectiveness =    90% with elipta > try change to trelegy   Not optimally controlled with lots of "chest congestion" but minimal mucus production day >> noct did improve with prednisone transiently in past  ? Allergy/ ? gerd ? Adverse effects of DPI   rec Increase ppi to bid ac  Change neb back to albuterol (not ipatriopium since has LAMA on board which risks over treating with anticholinergic rx) up to every 4 h as needed  Prednisone 10 mg take  4 each am x 2 days,   2 each am x 2 days,  1 each am x 2 days and stop  Add mucinex dm prn "congestion" and consider flutter valve next ov   F/u in 2 weeks with all meds in hand using a trust but verify approach to confirm accurate Medication  Reconciliation The principal here is that until we are certain that the  patients are doing what we've asked, it makes no sense to ask them to do more.

## 2018-12-11 NOTE — Assessment & Plan Note (Signed)
01/03/2017   Walked RA  2 laps @ 185 ft each stopped due to  desat to 85% on second lap, corrected on 2lpm pulsed on 3rd at slow pace  Adequate control on present rx, reviewed in detail with pt > no change in rx needed    Each maintenance medication was reviewed in detail including most importantly the difference between maintenance and as needed and under what circumstances the prns are to be used.  Please see AVS for specific  Instructions which are unique to this visit and I personally typed out  which were reviewed in detail over the phone with the patient and a copy provided by mail.

## 2018-12-12 ENCOUNTER — Other Ambulatory Visit: Payer: Self-pay | Admitting: Internal Medicine

## 2018-12-23 ENCOUNTER — Ambulatory Visit: Payer: Medicare HMO | Admitting: Internal Medicine

## 2019-01-02 ENCOUNTER — Ambulatory Visit: Payer: Medicare HMO | Admitting: Internal Medicine

## 2019-01-07 ENCOUNTER — Other Ambulatory Visit: Payer: Self-pay | Admitting: Internal Medicine

## 2019-01-07 ENCOUNTER — Ambulatory Visit: Payer: Medicare HMO | Admitting: Internal Medicine

## 2019-01-10 ENCOUNTER — Telehealth: Payer: Self-pay | Admitting: Internal Medicine

## 2019-01-10 MED ORDER — TRELEGY ELLIPTA 100-62.5-25 MCG/INH IN AEPB: 1.0000 | INHALATION_SPRAY | Freq: Every day | RESPIRATORY_TRACT | 5 refills | Status: DC

## 2019-01-10 NOTE — Telephone Encounter (Signed)
Call made to patient, confirmed DOB, pharmacy, and medication. Refill sent. Voiced understanding. Nothing further needed at this time.

## 2019-02-11 ENCOUNTER — Other Ambulatory Visit: Payer: Self-pay | Admitting: Internal Medicine

## 2019-03-05 ENCOUNTER — Telehealth: Payer: Self-pay | Admitting: Internal Medicine

## 2019-03-05 MED ORDER — ALBUTEROL SULFATE (2.5 MG/3ML) 0.083% IN NEBU: 2.5000 mg | INHALATION_SOLUTION | RESPIRATORY_TRACT | 11 refills | Status: AC | PRN

## 2019-03-05 NOTE — Telephone Encounter (Signed)
Refill of albuterol neb sol has been sent to pt's preferred pharmacy. Called and spoke with pt letting her know that this had been done. Pt verbalized understanding. Nothing further needed.

## 2019-03-11 ENCOUNTER — Encounter: Payer: Self-pay | Admitting: Internal Medicine

## 2019-03-11 ENCOUNTER — Other Ambulatory Visit: Payer: Self-pay | Admitting: Internal Medicine

## 2019-03-11 ENCOUNTER — Ambulatory Visit (INDEPENDENT_AMBULATORY_CARE_PROVIDER_SITE_OTHER): Payer: Medicare HMO | Admitting: Internal Medicine

## 2019-03-11 ENCOUNTER — Other Ambulatory Visit: Payer: Self-pay

## 2019-03-11 DIAGNOSIS — J441 Chronic obstructive pulmonary disease with (acute) exacerbation: Secondary | ICD-10-CM

## 2019-03-11 DIAGNOSIS — J9611 Chronic respiratory failure with hypoxia: Secondary | ICD-10-CM

## 2019-03-11 MED ORDER — PREDNISONE 10 MG PO TABS: ORAL_TABLET | ORAL | 0 refills | Status: DC

## 2019-03-11 MED ORDER — AZITHROMYCIN 250 MG PO TABS: ORAL_TABLET | ORAL | 0 refills | Status: DC

## 2019-03-11 NOTE — Assessment & Plan Note (Signed)
01/03/2017   Walked RA  2 laps @ 185 ft each stopped due to  desat to 85% on second lap, corrected on 2lpm pulsed on 3rd at slow pace  Despite aecopd > Adequate control on present rx, reviewed in detail with pt > no change in rx needed     Each maintenance medication was reviewed in detail including most importantly the difference between maintenance and as needed and under what circumstances the prns are to be used.  Please see AVS for specific  Instructions which are unique to this visit and I personally typed out  which were reviewed in detail over the phone with the patient and a copy provided via mail

## 2019-03-11 NOTE — Assessment & Plan Note (Signed)
Cleda Daub 10/14/2013: FeV1 31% FVC 58% FeV1/FVC 53%  Good response to BDs - acute flare since 03/05/19 - 03/11/2019 rx pred x 6 d, zpak and f/u in 2 weeks if not better   Group D in terms of symptom/risk and laba/lama/ICS  therefore appropriate rx at this point >>>  Continue trelegy with ABC action plan reviewed  Acute flare c/w acute bronchitis with the HA chills gone now so could have been COVID 19 but she has no contacts and over the worst of it s desats so rec  Zpak/ Prednisone 10 mg take  4 each am x 2 days,   2 each am x 2 days,  1 each am x 2 days and stop/ no change maint/prns  Pt informed of the seriousness of COVID 19 infection as a direct risk to lung health  and safey and to close contacts and should continue to wear a facemask in public and minimize exposure to public locations but especially avoid any area or activity where non-close contacts are not observing distancing or wearing an appropriate face mask.  I strongly recommended vaccine when offered.     >>> f/u in 2 weeks if not better, o/w 6 m to prevent exp to covid 19

## 2019-03-11 NOTE — Patient Instructions (Signed)
Plan A = Automatic= Always = Trelegy one click each am - take two good deep drags then rinse and gargle immediately to keep the powder out of your throat   Add Prilosec 20 mg Take 30- 60 min before your first and last meals of the day   Prednisone 10 mg take  4 each am x 2 days,   2 each am x 2 days,  1 each am x 2 days and stop   Zpak   Plan B = Backup Only use your albuterol inhaler as a rescue medication to be used if you can't catch your breath by resting or doing a relaxed purse lip breathing pattern.  - The less you use it, the better it will work when you need it. - Ok to use the inhaler up to 2 puffs  every 4 hours if you must but call for appointment if use goes up over your usual need - Don't leave home without it !!  (think of it like the spare tire for your car)   Plan C = Crisis - only use your albuterol nebulizer (not the one with ipatropium in it)  if you first try Plan B and it fails to help > ok to use the nebulizer up to every 4 hours but if start needing it regularly call for immediate appointment  For cough  >  mucinex dm up to 1200 mg every 12 hours as needed   If not all better then return in 2 weeks - otherwise office visit in 6 months
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
## 2019-03-17 ENCOUNTER — Other Ambulatory Visit: Payer: Self-pay | Admitting: Internal Medicine

## 2019-05-14 ENCOUNTER — Ambulatory Visit (INDEPENDENT_AMBULATORY_CARE_PROVIDER_SITE_OTHER): Payer: Medicare HMO | Admitting: Acute Care

## 2019-05-14 ENCOUNTER — Telehealth: Payer: Self-pay | Admitting: Internal Medicine

## 2019-05-14 ENCOUNTER — Other Ambulatory Visit: Payer: Self-pay

## 2019-05-14 ENCOUNTER — Encounter: Payer: Self-pay | Admitting: Acute Care

## 2019-05-14 DIAGNOSIS — Z9981 Dependence on supplemental oxygen: Secondary | ICD-10-CM

## 2019-05-14 DIAGNOSIS — Z7951 Long term (current) use of inhaled steroids: Secondary | ICD-10-CM | POA: Diagnosis not present

## 2019-05-14 DIAGNOSIS — J441 Chronic obstructive pulmonary disease with (acute) exacerbation: Secondary | ICD-10-CM

## 2019-05-14 DIAGNOSIS — Z87891 Personal history of nicotine dependence: Secondary | ICD-10-CM

## 2019-05-14 DIAGNOSIS — J9611 Chronic respiratory failure with hypoxia: Secondary | ICD-10-CM

## 2019-05-14 DIAGNOSIS — Z7952 Long term (current) use of systemic steroids: Secondary | ICD-10-CM

## 2019-05-14 MED ORDER — DOXYCYCLINE HYCLATE 100 MG PO TABS: 100.0000 mg | ORAL_TABLET | Freq: Two times a day (BID) | ORAL | 0 refills | Status: AC

## 2019-05-14 MED ORDER — PREDNISONE 10 MG PO TABS: ORAL_TABLET | ORAL | 0 refills | Status: DC

## 2019-05-14 NOTE — Telephone Encounter (Signed)
Called and spoke to pt. Pt c/o increase in SOB, cough with little mucus production - pt states she produced a yellow/orange/red mucus and thinks it was blood. Pt states the 'hemoptysis' has only happened once today but did have the same thing happen in early Jan 2021 and saw MW on 1/5 and was rx'ed pred and zpak. Pt states she had left over pred and has already taken 30mg  of pred today and thinks she feels better. Pt states she had a temperature of 99.3 on 3/9 but normal today. Pt has been scheduled a televisit with 5/9, NP, today at 1600. Pt verbalized understanding and is aware to seek emergency care if any s/s worsen.   Will forward to SG as FYI.

## 2019-05-14 NOTE — Progress Notes (Signed)
Virtual Visit via Telephone Note  I connected with Teresa Jenkins on 05/14/19 at  4:00 PM EST by telephone and verified that I am speaking with the correct person using two identifiers.  Location: Patient: At home Provider: At the office, [address], [place], [place], [address]   I discussed the limitations, risks, security and privacy concerns of performing an evaluation and management service by telephone and the availability of in person appointments. I also discussed with the patient that there may be a patient responsible charge related to this service. The patient expressed understanding and agreed to proceed.  Synopsis: 57 yowf  former smoker ( Quit 1995 with a 30 pack year smoking history)From [place] moved to [place] in 1970s quit smoking in 1995 with breathing bad to worse with h/o exacerbations she related to work exposure and lots of rhinitis which has persisted  eval by [place] in [place] dx mold and prev under care of Dr Alcide Clever, Joya Gaskins then [place] but referred to pulmonary clinic 01/03/2017 by Dr Selena Batten for copd eval.She is followed by Dr. Faythe Casa one neg per Chodri   Maintenance : Trelegy  History of Present Illness: Pt. Presents for acute tele visit. She developed a cough 05/13/2019. She developed chills. She had a temperature of 99.3. This has resolved today. Because she was having trouble breathing she turned her oxygen to 3.5 L. She usually uses 2.5 L of oxygen.  Her oxygen saturation was 91%, and HR was 128 yesterday.Today it is better. Saturation is 97%. HR today is 69. She took 30 mg of prednisone that she had from an old prescription. She has been using her albuterol neb treatments about 3 times daily. She is not coughing, but does note secretions that are thick with some old  blood. She did have some pain in her back that has resolved. She denies any leg pain or swelling. She states she is using Mucinex. She denies any contact with anyone who could be a COVID carrier.  She has been in her house. She lives alone. She does not have any idea what the triggering event of this flare could be. She is compliant with Trelegy, Flonase and Singulair daily. She is compliant with her Zyrtec daily also. She denies fever today, no chest pain, back pain or orthopnea. One episode of old blood tinged secretions 3/9 which has resolved. She knows to call for any additional bloody secretions.   Pulmonary Embolism Wells Score Select Criteria:  Symptoms of DVT (3 points)>>0  No alternative diagnosis better explains the illness (3 points)>>0  Tachycardia with pulse > 100 (1.5 points)>> 69 today  Immobilization (>= 3 days) or surgery in the previous four weeks (1.5 points)>> )  Prior history of DVT or pulmonary embolism (1.5 points)>>0  Presence of hemoptysis (1 point)>> 1 ( one episode 3/9, old blood, scant, resolved)  Presence of malignancy (1 point)>> 0 Results: Total Criteria Point Count:   1>> Low Probability  Pulmonary Embolism Risk Score Interpretation Score > 6: High probability Score >= 2 and <= 6: Moderate probability Score < 2: Low Probability    Observations/Objective: Spirometry 12/2016 Very severe obstruction, with low vital capacity.  Assessment and Plan: COPD Flare Took 30 mg prednisone this am left over form previous  prescription Plan Doxycycline 100 mg twice daily x 7 days Take Probiotic with antibiotic. We will send in a prednisone taper. Prednisone taper; 10 mg tablets: 4 tabs x 2 days, 3 tabs x 2 days, 2 tabs x  2 days 1 tab x 2 days then stop. Continue Trelegy, Flonase, Zyrtec  and Singulair daily as you have been doing.  Continue using your neb treatments as needed for breakthrough shortness of breath  Continue Mucinex 1200 mg twice daily with a full glass of water.  Continue using oxygen at 2.5 L Saturation goals are 88-92% Call if you get worse not better with treatment ? Needs full set of PFT's at some point to assess diffusion  capacity Please contact office for sooner follow up if symptoms do not improve or worsen or seek emergency care     Follow Up Instructions: Follow up with Dr. Sherene Sires in 1 month   I discussed the assessment and treatment plan with the patient. The patient was provided an opportunity to ask questions and all were answered. The patient agreed with the plan and demonstrated an understanding of the instructions.   The patient was advised to call back or seek an in-person evaluation if the symptoms worsen or if the condition fails to improve as anticipated.  I provided 25  minutes of non-face-to-face time during this encounter.   Bevelyn Ngo, NP 05/14/2019 4:28 PM

## 2019-05-14 NOTE — Patient Instructions (Addendum)
It is good to talk with you today. We will prescribe Doxycycline 100 mg twice daily x 7 days Take Probiotic with antibiotic. Avoid the sun while on Doxycycline We will send in a prednisone taper. Prednisone taper; 10 mg tablets: 4 tabs x 2 days, 3 tabs x 2 days, 2 tabs x 2 days 1 tab x 2 days then stop. Continue Trelegy, Flonase, Zyrtec  and Singulair daily as you have been doing.  Continue using your neb treatments as needed for breakthrough shortness of breath  Continue Mucinex 1200 mg twice daily with a full glass of water.  Continue using oxygen at 2.5 L day and night Saturation goals are 88-92% Call if you get worse not better with treatment ? Needs full set of PFT's at some point to assess diffusion capacity Please contact office for sooner follow up if symptoms do not improve or worsen or seek emergency care

## 2019-05-14 NOTE — Telephone Encounter (Signed)
Thanks

## 2019-05-16 ENCOUNTER — Telehealth: Payer: Self-pay | Admitting: Internal Medicine

## 2019-05-16 ENCOUNTER — Encounter: Payer: Self-pay | Admitting: Acute Care

## 2019-05-16 NOTE — Telephone Encounter (Signed)
Called and spoke with pt to see which prescription she needed to be sent to the pharmacy and she said the prednisone.  Pt said after her visit with SG on 3/10, she stated she picked up her doxycycline abx but she could not get the prednisone due to the pharmacy stating to her that it was too early to refill it.  Stated to pt that I would call pharmacy in regards to prednisone prescription and then call her back once I had information for her and she verbalized understanding.  Called pt's pharmacy and spoke with Darel Hong about the message from pt. Darel Hong stated she was able to run the rx through and pt should be able to pick it up. Called and spoke with pt letting her know this and she verbalized understanding. Nothing further needed.

## 2019-06-14 IMAGING — DX DG CHEST 2V
2 series · 2 of 2 positions shown · non-contrast
Comparison: 01/03/2017.

CLINICAL DATA: Shortness of breath. COPD.

EXAM:
CHEST - 2 VIEW

[chest pa]
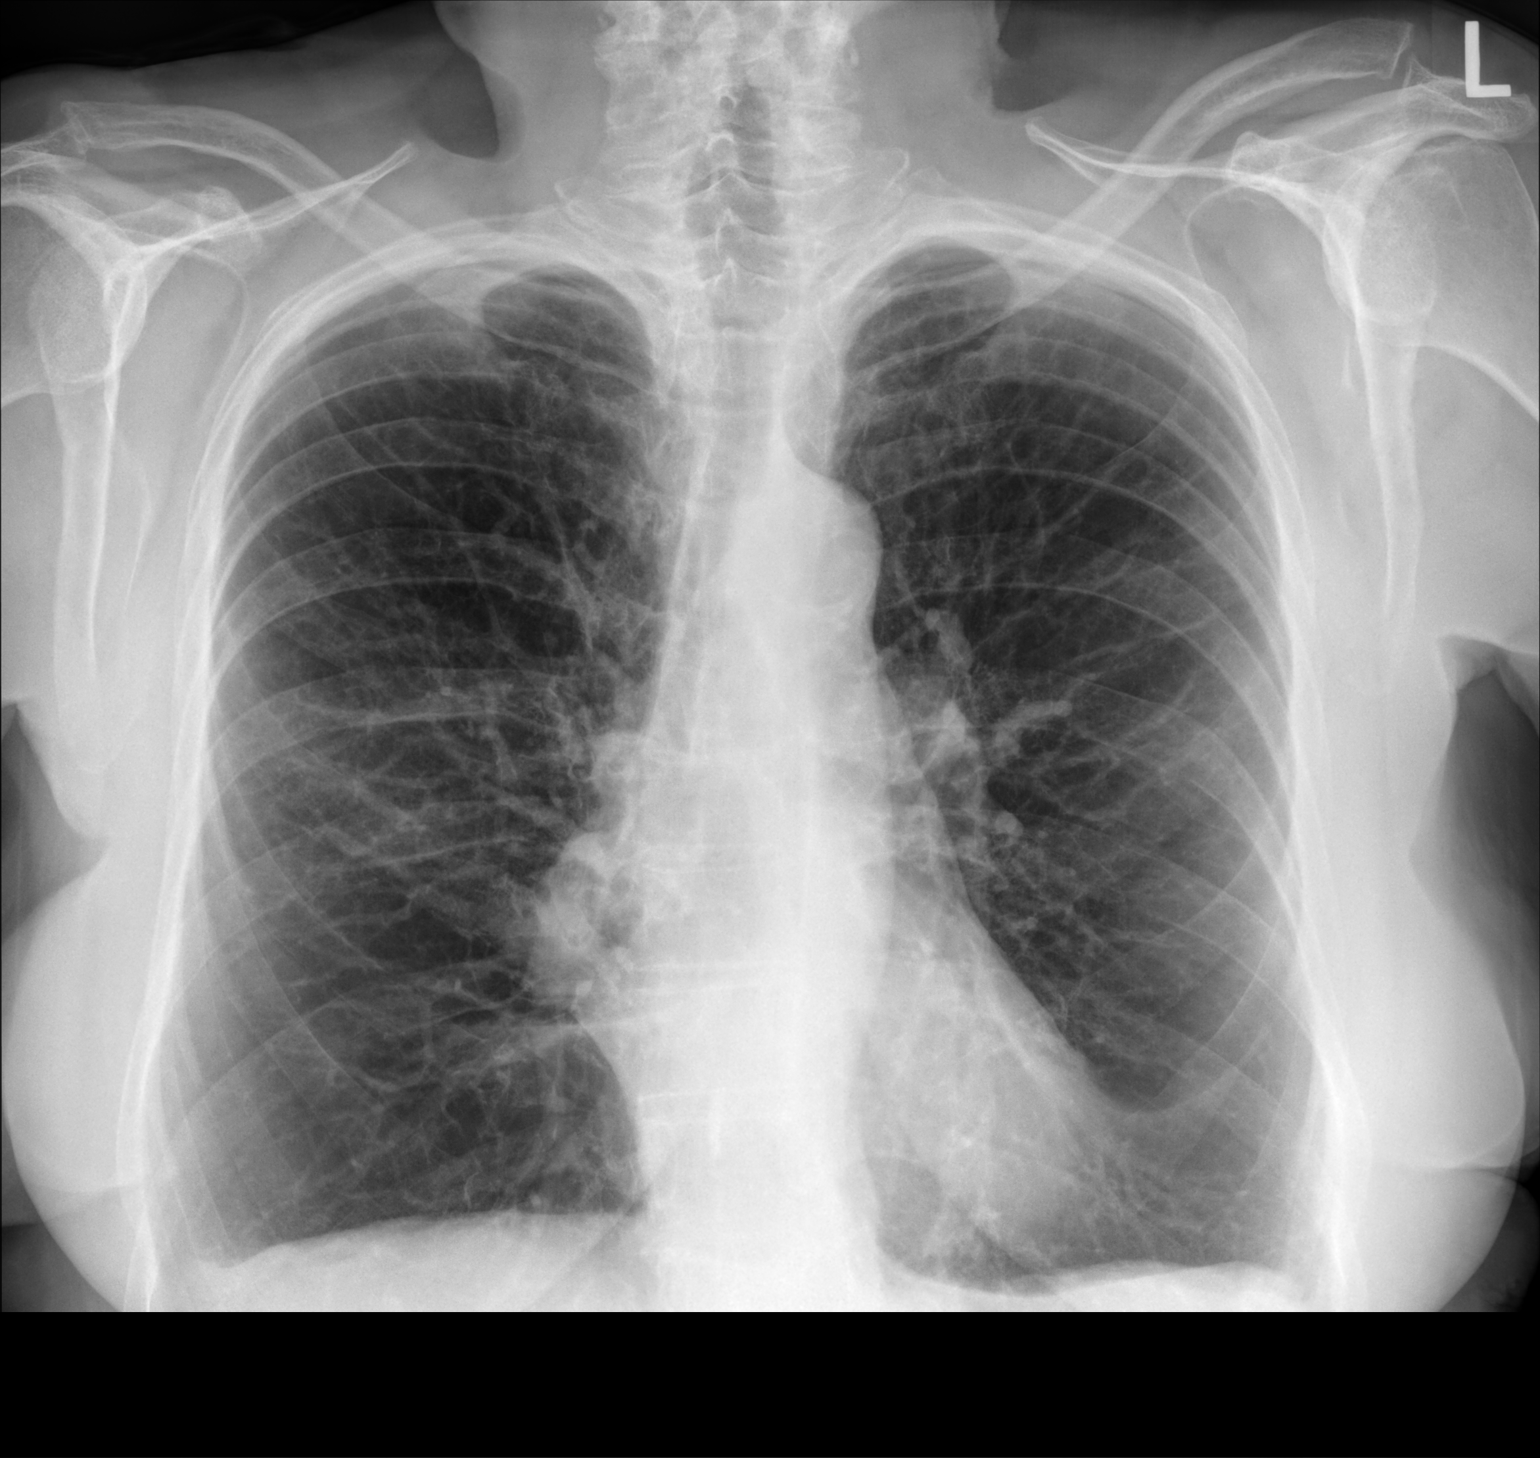

[chest lat]
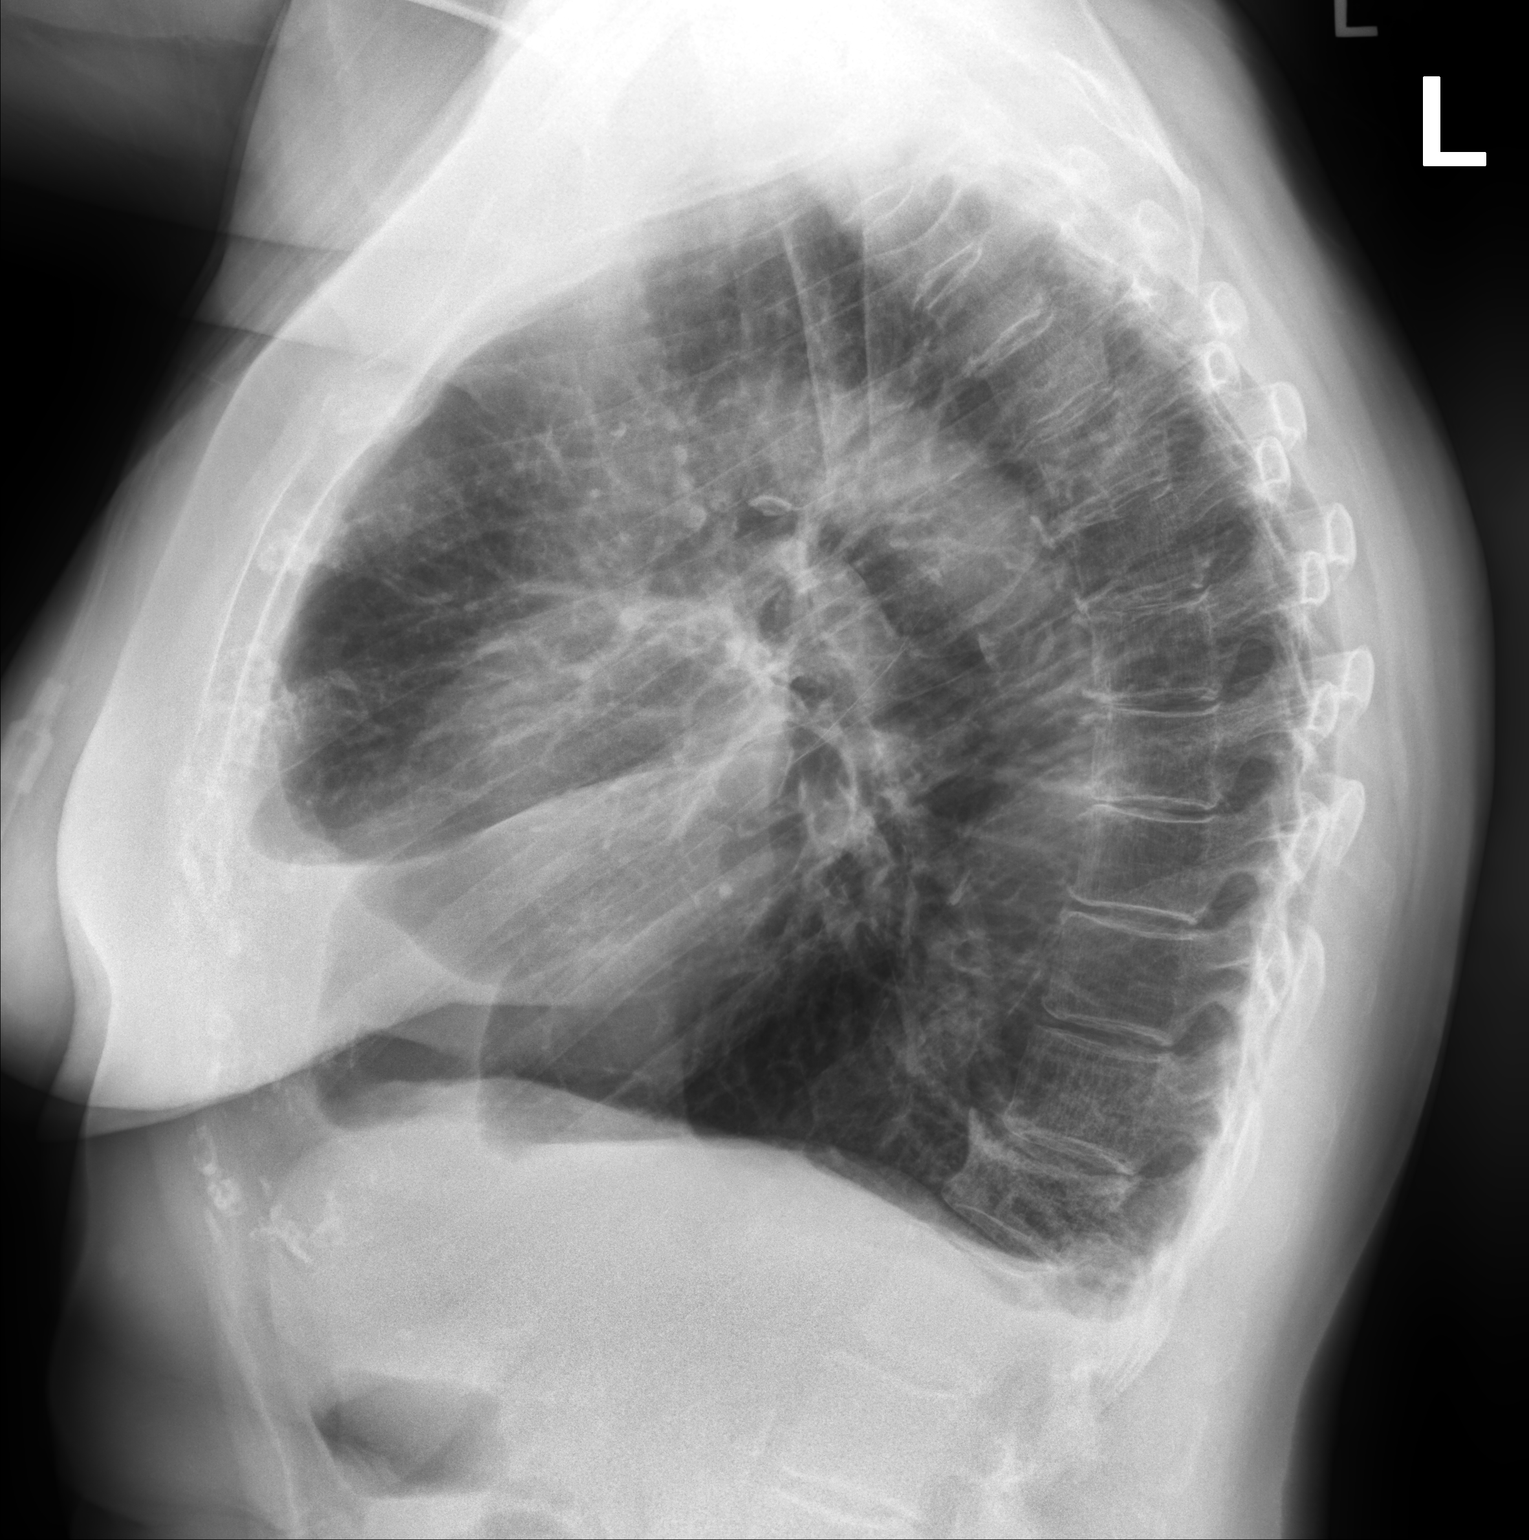

[2 of 2 positions shown; findings below may reference images not displayed]

FINDINGS: Normal heart size. Hyperinflation consistent with COPD. Calcified
tortuous aorta. No consolidation or edema. Chronic blunting CP
angle. Chronic lower thoracic vertebral body fracture.
IMPRESSION: COPD. No active disease.

## 2019-06-16 ENCOUNTER — Encounter: Payer: Self-pay | Admitting: Internal Medicine

## 2019-06-16 ENCOUNTER — Other Ambulatory Visit: Payer: Self-pay

## 2019-06-16 ENCOUNTER — Ambulatory Visit (INDEPENDENT_AMBULATORY_CARE_PROVIDER_SITE_OTHER): Payer: Medicare HMO

## 2019-06-16 ENCOUNTER — Ambulatory Visit: Payer: Medicare HMO | Admitting: Internal Medicine

## 2019-06-16 DIAGNOSIS — J449 Chronic obstructive pulmonary disease, unspecified: Secondary | ICD-10-CM

## 2019-06-16 DIAGNOSIS — M7989 Other specified soft tissue disorders: Secondary | ICD-10-CM

## 2019-06-16 DIAGNOSIS — J9611 Chronic respiratory failure with hypoxia: Secondary | ICD-10-CM | POA: Diagnosis not present

## 2019-06-16 MED ORDER — PREDNISONE 10 MG PO TABS: ORAL_TABLET | ORAL | 0 refills | Status: DC

## 2019-06-16 NOTE — Patient Instructions (Addendum)
Try albuterol 15 min before an activity that you know would make you short of breath and see if it makes any difference and if makes none then don't take it after activity unless you can't catch your breath.   Prednisone 10 mg take  4 each am x 2 days,   2 each am x 2 days,  1 each am x 2 days and stop    We will try to get you some home pulmonary rehab.   I strongly recommend the covid vaccine - the first one available   Please remember to go to the  x-ray department  for your tests - we will call you with the results when they are available    Discuss Aldactone addition to the hygroton but will need to be monitored for potassium and kidney function   Please schedule a follow up visit in 3 months but call sooner if needed

## 2019-06-16 NOTE — Progress Notes (Signed)
Subjective:    Patient ID: Teresa Jenkins, female   DOB: 18-May-1945,     MRN: [id number]    Brief patient profile:  71 yowf  From [place] moved to [place] in 1970s quit smoking in 1995 with breathing bad to worse with h/o exacerbations she related to work exposure and lots of rhinitis which has persisted  eval by [place] in [place] dx mold and prev under care of Dr Alcide Clever, Joya Gaskins then [place] but referred to pulmonary clinic 01/03/2017 by Dr   Selena Batten for copd eval.  Alpha one neg per Chodri     History of Present Illness  01/03/2017 1st [facility] office visit/ Teresa Jenkins   Chief Complaint  Patient presents with  . Pulmonary Consult    Referred by Dr. Salvadore Oxford. Pt states she was dxed with COPD approx 21 years ago. She states her PCP rec that she f/u with pulm again. She has been seen here in the past by Dr Joya Gaskins- last in 2016.  She states that today her breathing is "not too bad". She does get SOB with exertion such as grocery shopping, and she will not walk to her mailbox anymore due to having to walk up hill.    doe on best days = [employer] / [employer] ok leaning on cart on 2lpm / also 2lpm hs  = MMRC2 = can't walk a nl pace on a flat grade s sob but does fine slow and flat eg shopping leaning on cart   Presently maint on bud/ performist / incruse [place] wt 190  rec Continue your present inhalers Only use your albuterol as a rescue medication  Continue 2lpm at bedtime and with activities where your 02 drops below 90% by your pulse oximeter- this will depend on how far and fast you plan to walk but pace yourself or use enough 02 to keep above 90% at all times     07/16/2017  f/u ov/Marquavious Nazar re:    GOLD IV/ 02 dep at hs and with activity on perf/bud/ incruse  Chief Complaint  Patient presents with  . Follow-up    Breathing is doing well today.  She is using her ventolin 2 x daily on average.   Dyspnea:  [employer] ok on 3lpm = MMRC2 = can't walk a nl pace on a flat grade s sob but does fine  slow and flat  Cough: no Sleep: ok 2lpm SABA use:  2 x weekly  rec Plan A = Automatic = Incruse / perfomist/ budesonide as you are  Work on inhaler technique:  relax and gently blow all the way out then take a nice smooth deep breath back in, triggering the inhaler at same time you start breathing in.   Blow out thru nose. Rinse and gargle with water when done Plan B = Backup Only use your albuterol as a rescue medication  Plan C = Crisis - only use your albuterol nebulizer if you first try Plan B and it fails to help > ok to use the nebulizer up to every 4 hours but if start needing it regularly call for immediate appointment    03/13/2018  f/u ov/Teresa Jenkins re:  GOLD IV copd/ new leg swelling  Chief Complaint  Patient presents with  . Follow-up    Today is a bad day for her breathing. She is having a hard time affording her nebs. She has noticed swelling in her ankles for the past month. She is using her rescue inhaler about 4  x per wk on average.   Dyspnea:  MMRC3 = can't walk 100 yards even at a slow pace at a flat grade s stopping due to sob  On 3 lpm POC  Cough: none Sleeping: ok flat SABA use: 4x week 02: 2lpm at  home and 3 POC 2 rec Plan A = Automatic = Trelegy one click each am - take two good drags  Plan B = Backup Only use your albuterol inhaler as a rescue medication  Plan C = Crisis - only use your albuterol nebulizer if you first try Plan B  Add chlorthalidone 25 mg one daily    04/24/2018 acute extended ov/Rutger Salton re:  Bloody cough in setting of GOLD IV copd / 02 dep  Chief Complaint  Patient presents with  . Acute Visit    Pt c/o hemoptysis x 2 days. She states she has felt more SOB today.   acute 04/12/18 chills/ fever/ cough ha breathing worse than usual but never seen  Fever better p one day / never produced anything  Until "bleeding started" but total amt of blood is < a tsp mixed with mp secretions.   did not try saba yet for sob  rec When coughing >  prilosec 20mg   Take 30- 60 min before your first and last meals of the day  Best cough medication > mucinex dm 1200 mg every 12 hours  zpak Prednisone 10 mg take  4 each am x 2 days,   2 each am x 2 days,  1 each am x 2 days and stop     12/11/2018 televisit recs Plan A = Automatic= Always = Trelegy one click each am - take two good deep drags then rinse and gargle immediately to keep the powder out of your throat  Add Prilosec 20 mg Take 30- 60 min before your first and last meals of the day  Prednisone 10 mg take  4 each am x 2 days,   2 each am x 2 days,  1 each am x 2 days and stop  Plan B = Backup Only use your albuterol inhaler as a rescue medication Plan C = Crisis - only use your albuterol nebulizer (not the one with ipatropium in it)  For cough  >  mucinex dm up to 1200 mg every 12 hours as needed    05/14/2019 televisit: We will prescribe Doxycycline 100 mg twice daily x 7 days Take Probiotic with antibiotic. Avoid the sun while on Doxycycline We will send in a prednisone taper. Prednisone taper; 10 mg tablets: 4 tabs x 2 days, 3 tabs x 2 days, 2 tabs x 2 days 1 tab x 2 days then stop. Continue Trelegy, Flonase, Zyrtec  and Singulair daily as you have been doing.  Continue using your neb treatments as needed for breakthrough shortness of breath  Continue Mucinex 1200 mg twice daily with a full glass of water.  Continue using oxygen at 2.5 L day and night Saturation goals are 88-92% Call if you get worse not better with treatment ? Needs full set of PFT's at some point to assess diffusion capacity     06/16/2019  f/u ov/Teresa Jenkins re: GOLD IV  Chief Complaint  Patient presents with  . Follow-up  Dyspnea: housecleaning vacuuming / can't do mb/ picks up everything drive up -  shopping stopped with pandemic  Cough: none  Sleeping: no resp symptoms flat SABA use: neither saba  02: 2.5 lpm 24/7   No obvious day  to day or daytime variability or assoc excess/ purulent sputum or mucus plugs or  hemoptysis or cp or chest tightness, subjective wheeze or overt sinus or hb symptoms.   sleeping without nocturnal  or early am exacerbation  of respiratory  c/o's or need for noct saba. Also denies any obvious fluctuation of symptoms with weather or environmental changes or other aggravating or alleviating factors except as outlined above   No unusual exposure hx or h/o childhood pna/ asthma or knowledge of premature birth.  Current Allergies, Complete Past Medical History, Past Surgical History, Family History, and Social History were reviewed in [employer] record.  ROS  The following are not active complaints unless bolded Hoarseness, sore throat, dysphagia, dental problems, itching, sneezing,  nasal congestion or discharge of excess mucus or purulent secretions, ear ache,   fever, chills, sweats, unintended wt loss or wt gain, classically pleuritic or exertional cp,  orthopnea pnd or arm/hand swelling  or leg swelling despite hctz 25 mg daily , presyncope, palpitations, abdominal pain, anorexia, nausea, vomiting, diarrhea  or change in bowel habits or change in bladder habits, change in stools or change in urine, dysuria, hematuria,  rash, arthralgias, visual complaints, headache, numbness, weakness or ataxia or problems with walking or coordination,  change in mood or  memory.        Current Meds  Medication Sig  . albuterol (PROVENTIL) (2.5 MG/3ML) 0.083% nebulizer solution Take 3 mLs (2.5 mg total) by nebulization every 4 (four) hours as needed for wheezing or shortness of breath.  Marland Kitchen albuterol (VENTOLIN HFA) 108 (90 Base) MCG/ACT inhaler INHALE TWO PUFFS FOUR TIMES A DAY AS NEEDED FOR SHORTNESS OF BREATH  . calcium citrate-vitamin D 500-400 MG-UNIT chewable tablet Chew 1 tablet by mouth daily.  . cetirizine (ZYRTEC) 10 MG tablet Take 10 mg by mouth daily.  . chlorthalidone (HYGROTON) 25 MG tablet TAKE 1 TABLET BY MOUTH EVERY DAY  . Cholecalciferol (VITAMIN D-3) 5000  units TABS Take 1 tablet by mouth daily.  . Cyanocobalamin (B-12 PO) Take 1 capsule by mouth daily.  . fluticasone (FLONASE) 50 MCG/ACT nasal spray Place 2 sprays into both nostrils daily as needed.   . Fluticasone-Umeclidin-Vilant (TRELEGY ELLIPTA) 100-62.5-25 MCG/INH AEPB Inhale 1 puff into the lungs daily.  Marland Kitchen levothyroxine (SYNTHROID, LEVOTHROID) 75 MCG tablet Take 75 mcg by mouth daily before breakfast.  . MAGNESIUM PO Take 1 tablet by mouth daily.  . Misc Natural Products (DAILY HERBS VISION HEALTH PO) Take 1 tablet by mouth daily.  . montelukast (SINGULAIR) 10 MG tablet Take 10 mg by mouth at bedtime.  . Multiple Vitamin (MULTIVITAMIN) capsule Take 1 capsule by mouth daily.  Marland Kitchen omeprazole (PRILOSEC) 20 MG capsule TAKE 30- 60 MIN BEFORE YOUR FIRST AND LAST MEALS OF THE DAY  . OXYGEN 2lpm with rest and 3lpm with exertion  . PARoxetine (PAXIL-CR) 37.5 MG 24 hr tablet Take 37.5 mg by mouth daily.  . pravastatin (PRAVACHOL) 20 MG tablet Take 20 mg by mouth daily.  Marland Kitchen telmisartan (MICARDIS) 40 MG tablet Take 40 mg by mouth daily.          .               Objective:   Physical Exam    06/16/2019        165  04/24/2018        174 03/13/2018          176  07/16/2017       174  01/03/17 166 lb 12.8 oz (75.7 kg)  10/20/14 183 lb 6.4 oz (83.2 kg)  04/14/14 182 lb 9.6 oz (82.8 kg)      Vital signs reviewed  06/16/2019  - Note at rest 02 sats  93% on 4lpm pulsed     HEENT : pt wearing mask not removed for exam due to covid -19 concerns.    NECK :  without JVD/Nodes/TM/ nl carotid upstrokes bilaterally   LUNGS: no acc muscle use,  Mod barrel  contour chest wall with bilateral  Distant bs s audible wheeze and  without cough on insp or exp maneuvers and mod  Hyperresonant  to  percussion bilaterally     CV:  RRR  no s3 or murmur or increase in P2, and 1+ pitting edema both LE  ABD:  soft and nontender with pos mid insp Hoover's  in the supine position. No bruits or organomegaly  appreciated, bowel sounds nl  MS:     ext warm without deformities, calf tenderness, cyanosis or clubbing No obvious joint restrictions   SKIN: warm and dry without lesions    NEURO:  alert, approp, nl sensorium with  no motor or cerebellar deficits apparent.           CXR PA and Lateral:   06/16/2019 :    I personally reviewed images and   impression as follows:   Severe copd/ kyphosis, SSA rml or lingula no significant change       Assessment:

## 2019-06-17 ENCOUNTER — Telehealth: Payer: Self-pay | Admitting: *Deleted

## 2019-06-17 ENCOUNTER — Encounter: Payer: Self-pay | Admitting: Internal Medicine

## 2019-06-17 DIAGNOSIS — J449 Chronic obstructive pulmonary disease, unspecified: Secondary | ICD-10-CM

## 2019-06-17 NOTE — Assessment & Plan Note (Signed)
New onset 01/2018  - added chlorthalidone 03/13/2018 25 mg daily   - 06/16/2019 rec consider aldactone 25 mg add on but prefer diurectics be managed by pcp   ? If has element of cor pulmonale but best if one provider handle all the diuretic rx and prefer that be PCP with pulmonary as second choice as will need to have K bun and creat rechecks.          Each maintenance medication was reviewed in detail including emphasizing most importantly the difference between maintenance and prns and under what circumstances the prns are to be triggered using an action plan format where appropriate.  Total time for H and P, chart review, counseling, teaching device and generating customized AVS unique to this office visit / charting = 20 min

## 2019-06-17 NOTE — Telephone Encounter (Signed)
-----   Message from Nyoka Cowden, MD sent at 06/17/2019  5:39 AM EDT ----- See if we can order home pulmonary rehab for her

## 2019-06-17 NOTE — Assessment & Plan Note (Signed)
Quit smoking 1995 Alpha one neg per Chodri  Spirometry 01/03/2017  FEV1 0.60 (25%)  Ratio 47 with classic curvature   - 07/16/2017 declined rehab due to cost - 03/13/2018  After extensive coaching inhaler device,  effectiveness =    90% with elipta > try change to trelegy    Group D in terms of symptom/risk and laba/lama/ICS  therefore appropriate rx at this point >>>  Continue trelegy/ may benefit from home pulmonary rehab

## 2019-06-17 NOTE — Assessment & Plan Note (Signed)
01/03/2017   Walked RA  2 laps @ 185 ft each stopped due to  desat to 85% on second lap, corrected on 2lpm pulsed on 3rd at slow pace  Adequate rx at present = 2.5 lpm 24/7   Rec: Make sure you check your oxygen saturations at highest level of activity to be sure it stays over 90% and adjust upward to maintain this level if needed but remember to turn it back to previous settings when you stop (to conserve your supply).

## 2019-06-17 NOTE — Telephone Encounter (Signed)
Order was sent to [facility] 

## 2019-06-17 NOTE — Progress Notes (Signed)
Spoke with pt and notified of results per Dr. Wert. Pt verbalized understanding and denied any questions. 

## 2019-07-07 ENCOUNTER — Other Ambulatory Visit: Payer: Self-pay | Admitting: Internal Medicine

## 2019-07-16 ENCOUNTER — Telehealth: Payer: Self-pay | Admitting: Internal Medicine

## 2019-07-16 NOTE — Telephone Encounter (Signed)
Spoke with pt, she needs another order sent for Home Health. She could not get the service at that time but wants to start it now. She needs a new order sent. [facility] can we send the order in the system since it was sent on 4/13?

## 2019-07-16 NOTE — Telephone Encounter (Addendum)
Per the note on this 4/13 [facility] Order, pt was accepted by [facility] for [facility].  I will reach out to them tomorrow to see what is going on.    Attempted to reach pt, but no ans & MB full.  Will reach out to pt again tomorrow.

## 2019-07-18 NOTE — Telephone Encounter (Signed)
Waiting on response from [facility].

## 2019-07-21 NOTE — Telephone Encounter (Signed)
We are going to have to see if anyone else is abe to take on this patient due to the following response:  Sheron Nightingale sent to Sheron Nightingale; Reinaldo Raddle, Franklyn Lor, [place]  Unfortunately, we do not have the staffing to take this pt on service. We have to decline. Sorry!

## 2019-07-21 NOTE — Telephone Encounter (Signed)
Response from [facility]: Sheron Nightingale sent to Dewitt Rota, Raynald Blend!   I'm checking on this now. I'll let you know shortly. Thanks!

## 2019-07-23 NOTE — Telephone Encounter (Signed)
Waiting on response from [facility].

## 2019-07-24 NOTE — Telephone Encounter (Signed)
Rec'd this response from [facility]:  Rhunette Croft sent to Herbie Saxon,   I just realized this patient lives in [place]. We don't have staff to cover that area with home health. We do with hospice but not HH. I'm so sorry. I believe [facility] takes [employer] and services that area, if that helps. Other HH agencies won't take referrals from me unless I can give them a good payer like Medicare to go with it. I don't have one at this time. Thanks for trying to use me. Hope you are well!   Eber Jones   Sending order to [facility] to see if they can accommodate the patient.

## 2019-08-13 ENCOUNTER — Telehealth: Payer: Self-pay | Admitting: Internal Medicine

## 2019-08-13 NOTE — Telephone Encounter (Signed)
Patient calling with acute symptoms. SOB with activity x 3 days. Productive cough x 2 days, thick yellow sputum. Wheezing x 3 days. No chills or fever. Appointment made with [name] for 08/14/2019, telephone visit.  Patient feels she is well enough to wait for appointment time. Patient encouraged to seek emergent care should symptoms worsen overnight.

## 2019-08-14 ENCOUNTER — Ambulatory Visit (INDEPENDENT_AMBULATORY_CARE_PROVIDER_SITE_OTHER): Payer: Medicare HMO | Admitting: Adult Health

## 2019-08-14 ENCOUNTER — Encounter: Payer: Self-pay | Admitting: Adult Health

## 2019-08-14 ENCOUNTER — Other Ambulatory Visit: Payer: Self-pay

## 2019-08-14 DIAGNOSIS — J9611 Chronic respiratory failure with hypoxia: Secondary | ICD-10-CM

## 2019-08-14 DIAGNOSIS — J441 Chronic obstructive pulmonary disease with (acute) exacerbation: Secondary | ICD-10-CM | POA: Diagnosis not present

## 2019-08-14 MED ORDER — PREDNISONE 10 MG PO TABS: ORAL_TABLET | ORAL | 0 refills | Status: AC

## 2019-08-14 MED ORDER — AZITHROMYCIN 250 MG PO TABS: ORAL_TABLET | ORAL | 0 refills | Status: AC

## 2019-08-14 NOTE — Progress Notes (Signed)
Virtual Visit via Telephone Note  I connected with Teresa Jenkins on 08/14/19 at 11:30 AM EDT by telephone and verified that I am speaking with the correct person using two identifiers.  Location: Patient: Home   Provider: Home Office    I discussed the limitations, risks, security and privacy concerns of performing an evaluation and management service by telephone and the availability of in person appointments. I also discussed with the patient that there may be a patient responsible charge related to this service. The patient expressed understanding and agreed to proceed.   History of Present Illness: 74 yo female former smoker followed for COPD and Oxygen dependent respiratory failure -prone to frequent COPD exacerbations  Today's televisit as an acute office visit.  Patient complains over the last week she has had increased cough congestion thick yellow mucus and increased shortness of breath.  She has started using her albuterol nebulizers with some help.  She remains on Trelegy inhaler daily.  She is on oxygen 3 L.  She says her O2 saturations have been 96%.  She denies any hemoptysis chest pain orthopnea PND or increased leg swelling.  She has been checking her oxygen levels at home and they have been 96% on 3 L. She has not received her Covid vaccine and does not plan on getting it  Patient Active Problem List   Diagnosis Date Noted  . Leg swelling 03/14/2018  . Chronic respiratory failure with hypoxia (HCC) 01/04/2017  . COPD GOLD IV  01/03/2017  . Obesity (BMI 30-39.9) 09/23/2013  . Bronchitis, chronic obstructive, with exacerbation (HCC)   . Benign hypertensive heart disease   . Acid reflux   . Hyperlipidemia   . Osteopenia     Current Outpatient Medications on File Prior to Visit  Medication Sig Dispense Refill  . albuterol (PROVENTIL) (2.5 MG/3ML) 0.083% nebulizer solution Take 3 mLs (2.5 mg total) by nebulization every 4 (four) hours as needed for wheezing or shortness of  breath. 75 mL 11  . albuterol (VENTOLIN HFA) 108 (90 Base) MCG/ACT inhaler INHALE TWO PUFFS FOUR TIMES A DAY AS NEEDED FOR SHORTNESS OF BREATH 18 g 3  . calcium citrate-vitamin D 500-400 MG-UNIT chewable tablet Chew 1 tablet by mouth daily.    . cetirizine (ZYRTEC) 10 MG tablet Take 10 mg by mouth daily.    . chlorthalidone (HYGROTON) 25 MG tablet TAKE 1 TABLET BY MOUTH EVERY DAY 90 tablet 3  . Cholecalciferol (VITAMIN D-3) 5000 units TABS Take 1 tablet by mouth daily.    . Cyanocobalamin (B-12 PO) Take 1 capsule by mouth daily.    . fluticasone (FLONASE) 50 MCG/ACT nasal spray Place 2 sprays into both nostrils daily as needed.     Marland Kitchen levothyroxine (SYNTHROID, LEVOTHROID) 75 MCG tablet Take 75 mcg by mouth daily before breakfast.    . MAGNESIUM PO Take 1 tablet by mouth daily.    . Misc Natural Products (DAILY HERBS VISION HEALTH PO) Take 1 tablet by mouth daily.    . montelukast (SINGULAIR) 10 MG tablet Take 10 mg by mouth at bedtime.    . Multiple Vitamin (MULTIVITAMIN) capsule Take 1 capsule by mouth daily.    Marland Kitchen omeprazole (PRILOSEC) 20 MG capsule TAKE 30- 60 MIN BEFORE YOUR FIRST AND LAST MEALS OF THE DAY 180 capsule 3  . OXYGEN 2lpm with rest and 3lpm with exertion    . PARoxetine (PAXIL-CR) 37.5 MG 24 hr tablet Take 37.5 mg by mouth daily.    . pravastatin (PRAVACHOL)  20 MG tablet Take 20 mg by mouth daily.    Marland Kitchen telmisartan (MICARDIS) 40 MG tablet Take 40 mg by mouth daily.    . TRELEGY ELLIPTA 100-62.5-25 MCG/INH AEPB TAKE 1 PUFF BY MOUTH EVERY DAY 60 each 11   No current facility-administered medications on file prior to visit.      Observations/Objective: Speaks in full sentences with no audible distress  Assessment and Plan: Recurrent COPD exacerbations.  We will treat with short course of antibiotics and prednisone.  Patient return back in the office in 3 to 4 weeks as planned.  At that time would consider adding additional medications to decrease exacerbation potential.  Plan   Patient Instructions  zpcvk  pred  covid test  1 month with wert      Follow Up Instructions: Follow-up in 3 to 4 weeks and as needed   I discussed the assessment and treatment plan with the patient. The patient was provided an opportunity to ask questions and all were answered. The patient agreed with the plan and demonstrated an understanding of the instructions.   The patient was advised to call back or seek an in-person evaluation if the symptoms worsen or if the condition fails to improve as anticipated.  I provided 24  minutes of non-face-to-face time during this encounter.   Rexene Edison, NP

## 2019-08-14 NOTE — Patient Instructions (Addendum)
Z-Pak take as directed  Prednisone taper over the next week  COVID-19 testing  Mucinex DM twice daily as needed for cough and congestion Continue on Trelegy 1 puff daily  Albuterol nebulizer or inhaler as needed  Continue on oxygen 3 L  Follow-up in 4 weeks with Dr. Sherene Sires as planned and as needed  Please contact office for sooner follow up if symptoms do not improve or worsen or seek emergency care

## 2019-08-26 ENCOUNTER — Telehealth: Payer: Self-pay | Admitting: Internal Medicine

## 2019-08-26 NOTE — Telephone Encounter (Signed)
Patient is moving to [place]. She is asking for antibiotics and prednisone just in case she gets sick. Patient advised that we can not prescribe medications if she is not sick. She understands that she can still call us if symptoms develop and we will care for her until she establishes a doctor in her new location. Patient was relieved and does not need anything further.

## 2019-09-09 ENCOUNTER — Ambulatory Visit: Payer: Medicare HMO | Admitting: Internal Medicine

## 2019-09-15 ENCOUNTER — Ambulatory Visit: Payer: Medicare HMO | Admitting: Internal Medicine

## 2019-10-10 ENCOUNTER — Ambulatory Visit: Payer: Medicare HMO | Admitting: Internal Medicine

## 2020-08-05 IMAGING — DX DG CHEST 2V
2 series · 2 of 2 positions shown · non-contrast
Comparison: Chest x-ray dated November 26, 2018.

CLINICAL DATA: COPD.

EXAM:
CHEST - 2 VIEW

[chest pa]
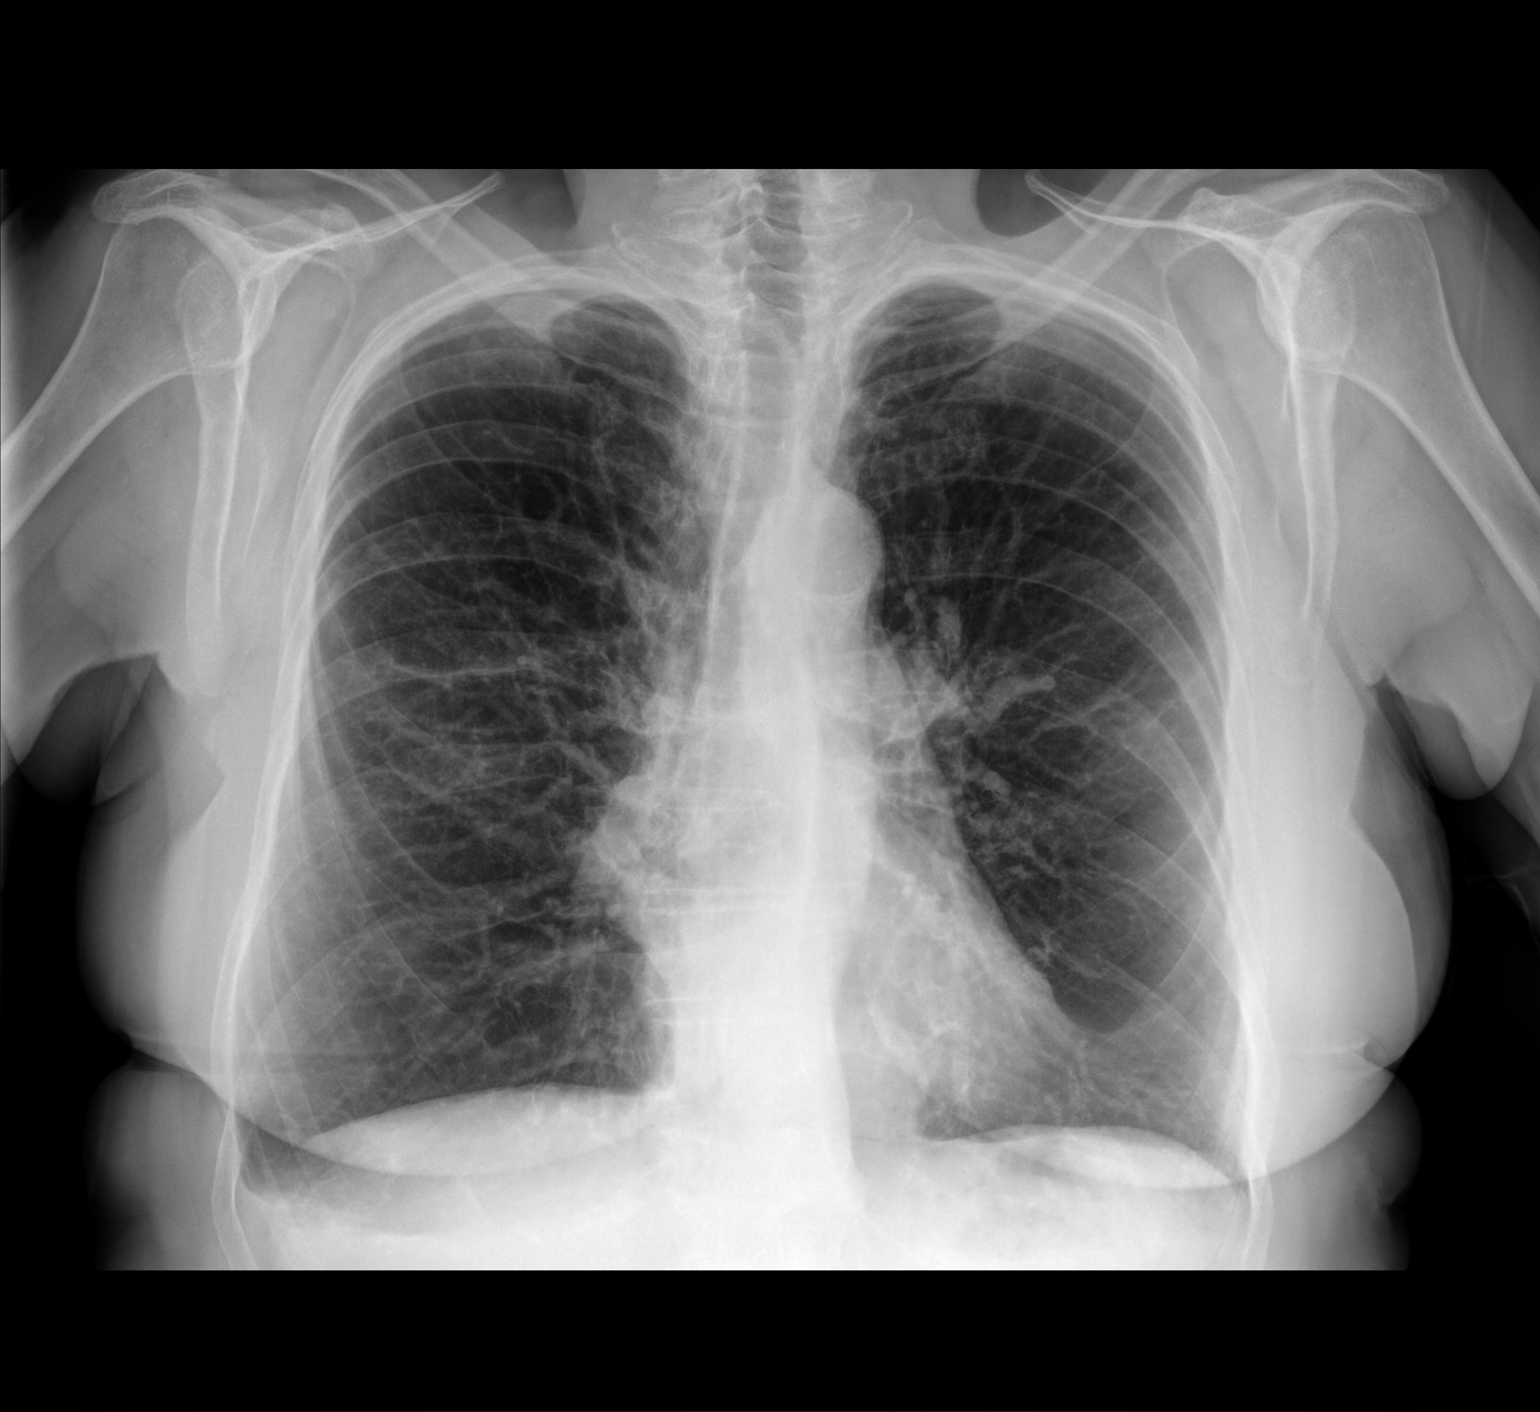

[chest lat]
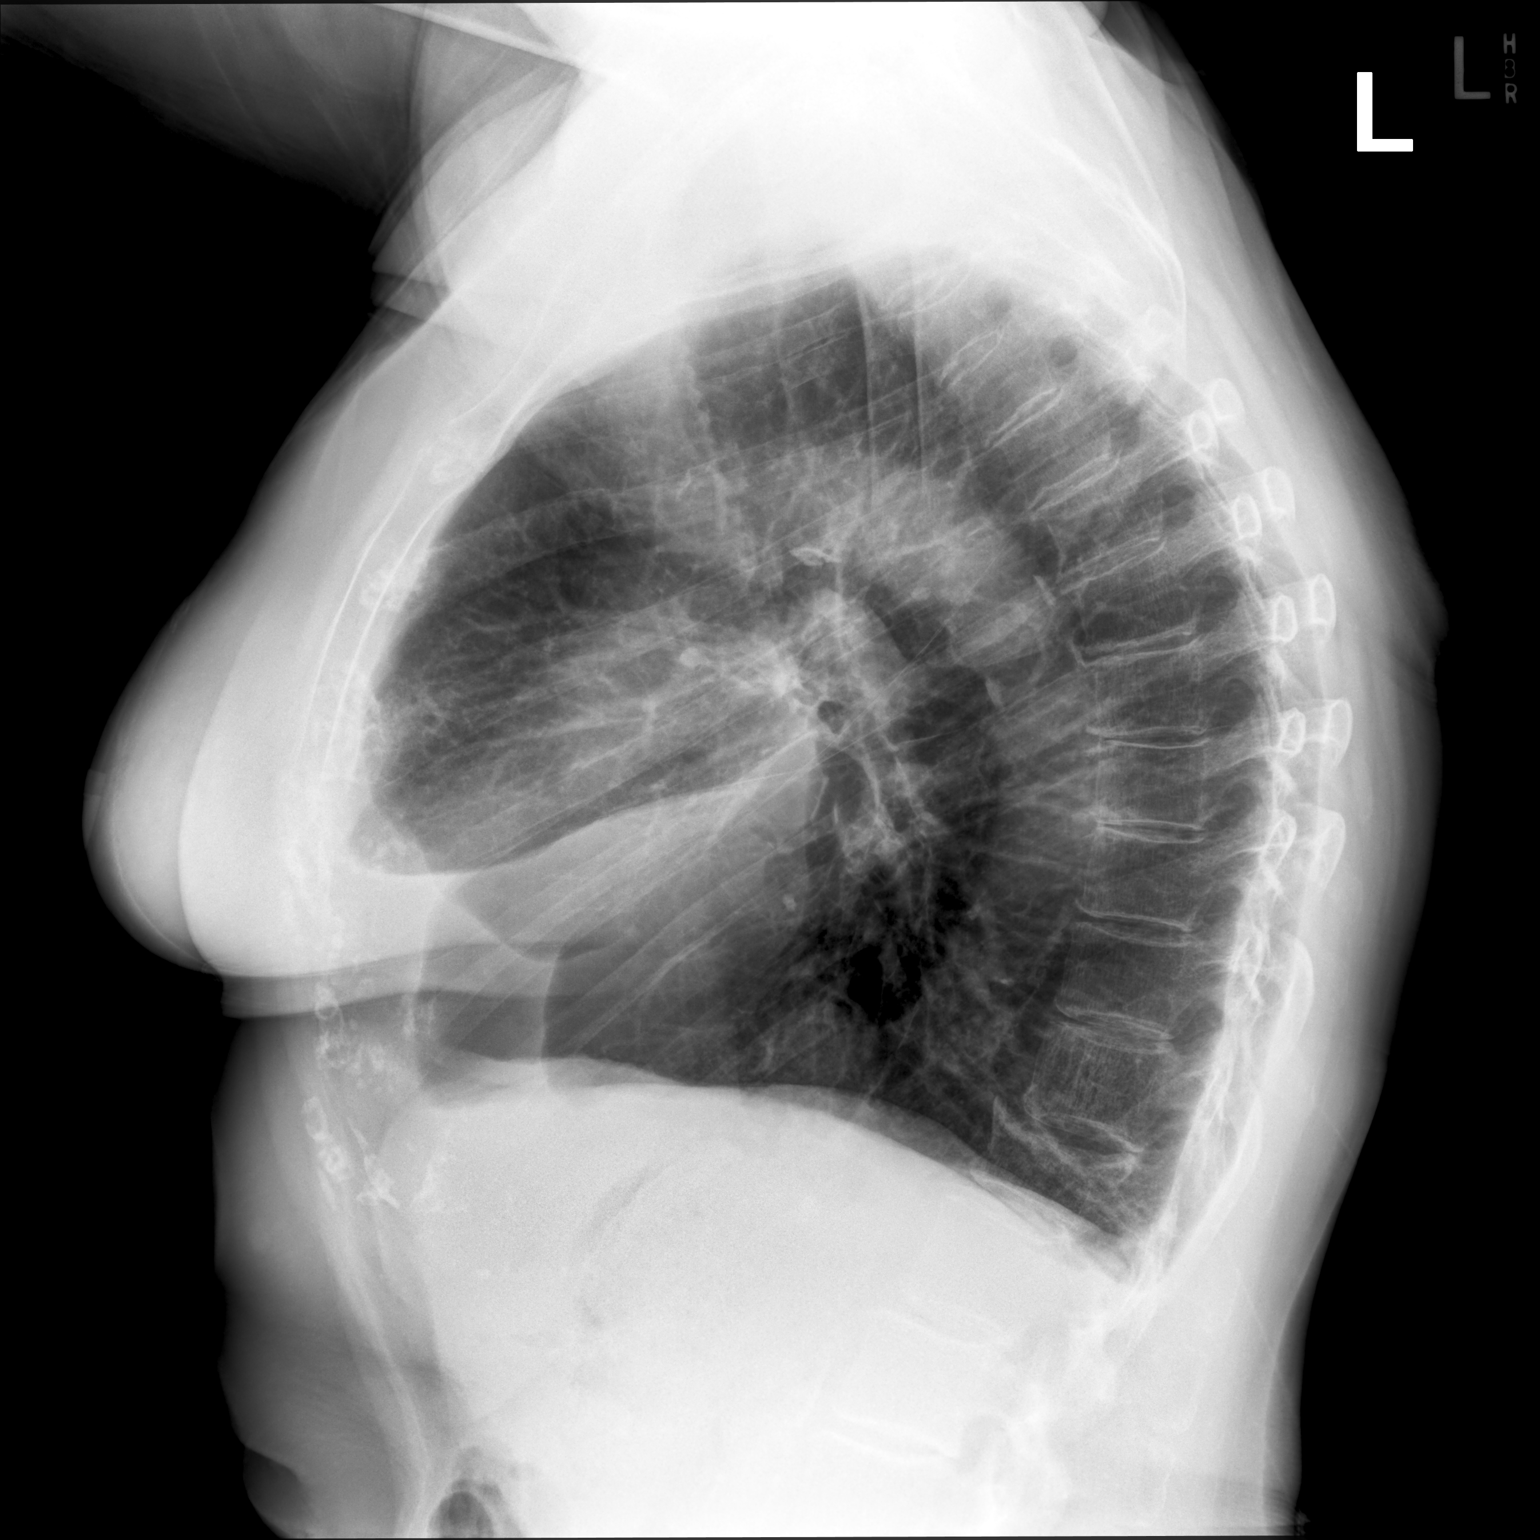

[2 of 2 positions shown; findings below may reference images not displayed]

FINDINGS: The heart size and mediastinal contours are within normal limits.
Atherosclerotic calcification of the aortic arch. Normal pulmonary
vascularity. The lungs remain hyperinflated with emphysematous
changes. No focal consolidation, pleural effusion, or pneumothorax.
No acute osseous abnormality. Chronic T12 compression deformity.
IMPRESSION: 1.  No active cardiopulmonary disease.
2. COPD.
